# Patient Record
Sex: Female | Born: 1945 | Hispanic: No | Marital: Married | Smoking: Never smoker
Health system: Southern US, Community
[De-identification: ages and names within clinical notes are randomized; demographics above are authoritative.]

## PROBLEM LIST (undated history)

## (undated) DIAGNOSIS — M810 Age-related osteoporosis without current pathological fracture: Secondary | ICD-10-CM

## (undated) DIAGNOSIS — N059 Unspecified nephritic syndrome with unspecified morphologic changes: Secondary | ICD-10-CM

## (undated) DIAGNOSIS — E119 Type 2 diabetes mellitus without complications: Secondary | ICD-10-CM

## (undated) DIAGNOSIS — I10 Essential (primary) hypertension: Secondary | ICD-10-CM

## (undated) DIAGNOSIS — E785 Hyperlipidemia, unspecified: Secondary | ICD-10-CM

## (undated) DIAGNOSIS — T7840XA Allergy, unspecified, initial encounter: Secondary | ICD-10-CM

## (undated) DIAGNOSIS — G4730 Sleep apnea, unspecified: Secondary | ICD-10-CM

## (undated) HISTORY — DX: Allergy, unspecified, initial encounter: T78.40XA

## (undated) HISTORY — DX: Hyperlipidemia, unspecified: E78.5

## (undated) HISTORY — DX: Essential (primary) hypertension: I10

## (undated) HISTORY — PX: FRACTURE SURGERY: SHX138

## (undated) HISTORY — DX: Type 2 diabetes mellitus without complications: E11.9

## (undated) HISTORY — PX: WRIST SURGERY: SHX841

## (undated) HISTORY — DX: Unspecified nephritic syndrome with unspecified morphologic changes: N05.9

## (undated) HISTORY — DX: Age-related osteoporosis without current pathological fracture: M81.0

## (undated) HISTORY — DX: Sleep apnea, unspecified: G47.30

## (undated) HISTORY — PX: COLONOSCOPY: SHX174

---
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
## 1983-04-30 HISTORY — PX: OTHER SURGICAL HISTORY: SHX169

## 1993-04-29 HISTORY — PX: OTHER SURGICAL HISTORY: SHX169

## 2006-04-29 DIAGNOSIS — C801 Malignant (primary) neoplasm, unspecified: Secondary | ICD-10-CM

## 2006-04-29 HISTORY — PX: OTHER SURGICAL HISTORY: SHX169

## 2006-04-29 HISTORY — DX: Malignant (primary) neoplasm, unspecified: C80.1

## 2006-10-23 ENCOUNTER — Other Ambulatory Visit: Admission: RE | Admit: 2006-10-23 | Discharge: 2006-10-23 | Payer: Self-pay | Admitting: Family Medicine

## 2007-12-01 ENCOUNTER — Other Ambulatory Visit: Admission: RE | Admit: 2007-12-01 | Discharge: 2007-12-01 | Payer: Self-pay | Admitting: Family Medicine

## 2008-04-29 HISTORY — PX: BREAST BIOPSY: SHX20

## 2009-02-15 ENCOUNTER — Other Ambulatory Visit: Admission: RE | Admit: 2009-02-15 | Discharge: 2009-02-15 | Payer: Self-pay | Admitting: Family Medicine

## 2010-02-20 ENCOUNTER — Other Ambulatory Visit: Admission: RE | Admit: 2010-02-20 | Discharge: 2010-02-20 | Payer: Self-pay | Admitting: Family Medicine

## 2010-09-12 ENCOUNTER — Encounter (INDEPENDENT_AMBULATORY_CARE_PROVIDER_SITE_OTHER): Payer: Self-pay | Admitting: Surgery

## 2012-05-29 ENCOUNTER — Other Ambulatory Visit: Payer: Self-pay | Admitting: Family Medicine

## 2012-05-29 ENCOUNTER — Other Ambulatory Visit (HOSPITAL_COMMUNITY)
Admission: RE | Admit: 2012-05-29 | Discharge: 2012-05-29 | Disposition: A | Discharge status: Home / Self Care | Payer: BC Managed Care – PPO | Source: Ambulatory Visit | Attending: Family Medicine | Admitting: Family Medicine

## 2012-05-29 DIAGNOSIS — Z124 Encounter for screening for malignant neoplasm of cervix: Secondary | ICD-10-CM | POA: Insufficient documentation

## 2013-06-15 DIAGNOSIS — M8580 Other specified disorders of bone density and structure, unspecified site: Secondary | ICD-10-CM | POA: Insufficient documentation

## 2013-11-08 DIAGNOSIS — D126 Benign neoplasm of colon, unspecified: Secondary | ICD-10-CM | POA: Insufficient documentation

## 2014-03-16 DIAGNOSIS — I152 Hypertension secondary to endocrine disorders: Secondary | ICD-10-CM | POA: Insufficient documentation

## 2014-03-16 DIAGNOSIS — I10 Essential (primary) hypertension: Secondary | ICD-10-CM | POA: Insufficient documentation

## 2014-09-05 DIAGNOSIS — R7301 Impaired fasting glucose: Secondary | ICD-10-CM | POA: Insufficient documentation

## 2014-09-08 DIAGNOSIS — G4733 Obstructive sleep apnea (adult) (pediatric): Secondary | ICD-10-CM | POA: Insufficient documentation

## 2014-09-08 DIAGNOSIS — Z9989 Dependence on other enabling machines and devices: Secondary | ICD-10-CM | POA: Insufficient documentation

## 2015-10-21 DIAGNOSIS — I7789 Other specified disorders of arteries and arterioles: Secondary | ICD-10-CM | POA: Insufficient documentation

## 2018-05-05 ENCOUNTER — Other Ambulatory Visit: Payer: Self-pay | Admitting: Internal Medicine

## 2018-05-05 DIAGNOSIS — Z1231 Encounter for screening mammogram for malignant neoplasm of breast: Secondary | ICD-10-CM

## 2018-05-05 DIAGNOSIS — M810 Age-related osteoporosis without current pathological fracture: Secondary | ICD-10-CM | POA: Insufficient documentation

## 2018-11-12 DIAGNOSIS — J309 Allergic rhinitis, unspecified: Secondary | ICD-10-CM | POA: Insufficient documentation

## 2018-11-12 DIAGNOSIS — G2581 Restless legs syndrome: Secondary | ICD-10-CM | POA: Insufficient documentation

## 2018-11-13 DIAGNOSIS — Z9889 Other specified postprocedural states: Secondary | ICD-10-CM | POA: Insufficient documentation

## 2019-01-11 ENCOUNTER — Other Ambulatory Visit: Payer: Self-pay | Admitting: Pediatrics

## 2019-01-11 DIAGNOSIS — Z1231 Encounter for screening mammogram for malignant neoplasm of breast: Secondary | ICD-10-CM

## 2019-02-16 ENCOUNTER — Ambulatory Visit
Admission: RE | Admit: 2019-02-16 | Discharge: 2019-02-16 | Disposition: A | Discharge status: Home / Self Care | Payer: Medicare HMO | Source: Ambulatory Visit | Attending: Pediatrics | Admitting: Pediatrics

## 2019-02-16 DIAGNOSIS — Z1231 Encounter for screening mammogram for malignant neoplasm of breast: Secondary | ICD-10-CM

## 2019-05-27 ENCOUNTER — Ambulatory Visit: Payer: Medicare HMO

## 2019-06-03 ENCOUNTER — Ambulatory Visit: Payer: Medicare HMO

## 2019-06-05 ENCOUNTER — Ambulatory Visit: Payer: Medicare HMO | Attending: Internal Medicine

## 2019-06-05 DIAGNOSIS — Z23 Encounter for immunization: Secondary | ICD-10-CM

## 2019-06-05 NOTE — Progress Notes (Signed)
   Covid-19 Vaccination Clinic  Name:  Joyce Hatfield    MRN: [id number] DOB: June 04, 1945  06/05/2019  [name] was observed post Covid-19 immunization for 15 minutes without incidence. She was provided with Vaccine Information Sheet and instruction to access the V-Safe system.   [name] was instructed to call 911 with any severe reactions post vaccine: Marland Kitchen Difficulty breathing  . Swelling of your face and throat  . A fast heartbeat  . A bad rash all over your body  . Dizziness and weakness    Immunizations Administered    Name Date Dose VIS Date Route   Pfizer COVID-19 Vaccine 06/05/2019  8:36 AM 0.3 mL 04/09/2019 Intramuscular   Manufacturer: [place]   Lot: [id number]   [place]: [id number]

## 2019-06-30 ENCOUNTER — Ambulatory Visit: Payer: Medicare HMO | Attending: Internal Medicine

## 2019-06-30 DIAGNOSIS — Z23 Encounter for immunization: Secondary | ICD-10-CM | POA: Insufficient documentation

## 2019-06-30 NOTE — Progress Notes (Signed)
   Covid-19 Vaccination Clinic  Name:  Masina Ratzloff    MRN: [id number] DOB: [birth year]  06/30/2019  Ms. Jipson was observed post Covid-19 immunization for 15 minutes without incident. She was provided with Vaccine Information Sheet and instruction to access the V-Safe system.   Ms. Nesheiwat was instructed to call 911 with any severe reactions post vaccine: Marland Kitchen Difficulty breathing  . Swelling of face and throat  . A fast heartbeat  . A bad rash all over body  . Dizziness and weakness   Immunizations Administered    Name Date Dose VIS Date Route   Pfizer COVID-19 Vaccine 06/30/2019  8:35 AM 0.3 mL 04/09/2019 Intramuscular   Manufacturer: [place]   Lot: [id number]   [place]: [id number]

## 2020-02-03 ENCOUNTER — Other Ambulatory Visit: Payer: Self-pay | Admitting: Pediatrics

## 2020-02-03 DIAGNOSIS — Z1231 Encounter for screening mammogram for malignant neoplasm of breast: Secondary | ICD-10-CM

## 2020-04-05 ENCOUNTER — Ambulatory Visit
Admission: RE | Admit: 2020-04-05 | Discharge: 2020-04-05 | Disposition: A | Discharge status: Home / Self Care | Payer: Medicare HMO | Source: Ambulatory Visit | Attending: Pediatrics | Admitting: Pediatrics

## 2020-04-05 ENCOUNTER — Other Ambulatory Visit: Payer: Self-pay

## 2020-04-05 DIAGNOSIS — Z1231 Encounter for screening mammogram for malignant neoplasm of breast: Secondary | ICD-10-CM

## 2020-05-25 ENCOUNTER — Ambulatory Visit: Payer: Medicare HMO | Admitting: Family Medicine

## 2020-06-10 ENCOUNTER — Encounter: Payer: Self-pay | Admitting: Family Medicine

## 2020-06-14 ENCOUNTER — Other Ambulatory Visit: Payer: Self-pay

## 2020-06-14 ENCOUNTER — Encounter: Payer: Self-pay | Admitting: Family Medicine

## 2020-06-14 ENCOUNTER — Ambulatory Visit (INDEPENDENT_AMBULATORY_CARE_PROVIDER_SITE_OTHER): Payer: Medicare HMO | Admitting: Family Medicine

## 2020-06-14 VITALS — BP 122/74 | HR 68 | Temp 97.3°F | Resp 16 | Ht 69.0 in | Wt 181.2 lb

## 2020-06-14 DIAGNOSIS — J309 Allergic rhinitis, unspecified: Secondary | ICD-10-CM

## 2020-06-14 DIAGNOSIS — Z85820 Personal history of malignant melanoma of skin: Secondary | ICD-10-CM | POA: Diagnosis not present

## 2020-06-14 DIAGNOSIS — R7301 Impaired fasting glucose: Secondary | ICD-10-CM | POA: Diagnosis not present

## 2020-06-14 DIAGNOSIS — Z1159 Encounter for screening for other viral diseases: Secondary | ICD-10-CM | POA: Diagnosis not present

## 2020-06-14 DIAGNOSIS — Z9989 Dependence on other enabling machines and devices: Secondary | ICD-10-CM | POA: Diagnosis not present

## 2020-06-14 DIAGNOSIS — G2581 Restless legs syndrome: Secondary | ICD-10-CM | POA: Diagnosis not present

## 2020-06-14 DIAGNOSIS — Z9889 Other specified postprocedural states: Secondary | ICD-10-CM | POA: Diagnosis not present

## 2020-06-14 DIAGNOSIS — G4733 Obstructive sleep apnea (adult) (pediatric): Secondary | ICD-10-CM

## 2020-06-14 DIAGNOSIS — M810 Age-related osteoporosis without current pathological fracture: Secondary | ICD-10-CM | POA: Diagnosis not present

## 2020-06-14 DIAGNOSIS — I10 Essential (primary) hypertension: Secondary | ICD-10-CM | POA: Diagnosis not present

## 2020-06-14 LAB — COMPREHENSIVE METABOLIC PANEL
ALT: 14 U/L (ref 0–35)
AST: 18 U/L (ref 0–37)
Albumin: 4.3 g/dL (ref 3.5–5.2)
Alkaline Phosphatase: 83 U/L (ref 39–117)
BUN: 18 mg/dL (ref 6–23)
CO2: 31 mEq/L (ref 19–32)
Calcium: 10.1 mg/dL (ref 8.4–10.5)
Chloride: 100 mEq/L (ref 96–112)
Creatinine, Ser: 0.79 mg/dL (ref 0.40–1.20)
GFR: 74.86 mL/min (ref >60.00)
Glucose, Bld: 138 mg/dL — ABNORMAL HIGH (ref 70–99)
Potassium: 4 mEq/L (ref 3.5–5.1)
Sodium: 139 mEq/L (ref 135–145)
Total Bilirubin: 1.5 mg/dL — ABNORMAL HIGH (ref 0.2–1.2)
Total Protein: 7 g/dL (ref 6.0–8.3)

## 2020-06-14 LAB — CBC WITH DIFFERENTIAL/PLATELET
Basophils Absolute: 0 10*3/uL (ref 0.0–0.1)
Basophils Relative: 0.6 % (ref 0.0–3.0)
Eosinophils Absolute: 0.1 10*3/uL (ref 0.0–0.7)
Eosinophils Relative: 1.7 % (ref 0.0–5.0)
HCT: 43.2 % (ref 36.0–46.0)
Hemoglobin: 15 g/dL (ref 12.0–15.0)
Lymphocytes Relative: 24.8 % (ref 12.0–46.0)
Lymphs Abs: 1.5 10*3/uL (ref 0.7–4.0)
MCHC: 34.7 g/dL (ref 30.0–36.0)
MCV: 86.7 fl (ref 78.0–100.0)
Monocytes Absolute: 0.3 10*3/uL (ref 0.1–1.0)
Monocytes Relative: 5.5 % (ref 3.0–12.0)
Neutro Abs: 4.2 10*3/uL (ref 1.4–7.7)
Neutrophils Relative %: 67.4 % (ref 43.0–77.0)
Platelets: 235 10*3/uL (ref 150.0–400.0)
RBC: 4.98 Mil/uL (ref 3.87–5.11)
RDW: 12.9 % (ref 11.5–15.5)
WBC: 6.2 10*3/uL (ref 4.0–10.5)

## 2020-06-14 LAB — TSH: TSH: 0.99 u[IU]/mL (ref 0.35–4.50)

## 2020-06-14 LAB — LIPID PANEL
Cholesterol: 227 mg/dL — ABNORMAL HIGH (ref 0–200)
HDL: 65 mg/dL (ref >39.00)
LDL Cholesterol: 125 mg/dL — ABNORMAL HIGH (ref 0–99)
NonHDL: 161.75
Total CHOL/HDL Ratio: 3
Triglycerides: 182 mg/dL — ABNORMAL HIGH (ref 0.0–149.0)
VLDL: 36.4 mg/dL (ref 0.0–40.0)

## 2020-06-14 LAB — HEMOGLOBIN A1C: Hgb A1c MFr Bld: 7.2 % — ABNORMAL HIGH (ref 4.6–6.5)

## 2020-06-14 MED ORDER — HYDROCHLOROTHIAZIDE 25 MG PO TABS: 25.0000 mg | ORAL_TABLET | Freq: Every day | ORAL | 3 refills | Status: DC

## 2020-06-14 NOTE — Patient Instructions (Signed)
Please return in 6 months for your annual complete physical; please come fasting.  I will release your lab results to you on your MyChart account with further instructions. Please reply with any questions.   It was a pleasure meeting you today! Thank you for choosing [place] to meet your healthcare needs! I truly look forward to working with you. If you have any questions or concerns, please send me a message via Mychart or call the office at [phone].  I have refilled your HCTZ; you may send in refill requests when you need refills on your other medications.

## 2020-06-14 NOTE — Progress Notes (Signed)
Subjective  CC:  Chief Complaint  Patient presents with  . New Patient (Initial Visit)    Patient is new to the area, previously seen by Dr. Janene Harvey with [facility]    HPI: Joyce Hatfield is a 75 y.o. female who presents to [facility] [place] today to establish care with me as a new patient.  I reviewed old records from most recent primary care provider.  Most recent labs are from July 2020. She has the following concerns or needs:  75 year old married female without children presents to establish care.  She is very healthy overall.  She lives an active lifestyle.  She bicycles and now rides a recumbent trike.  She hikes almost daily.  She has no new complaints.  We reviewed her past medical history documented in problem list below.  History of hypertension and has been well controlled on lisinopril 10 mg daily and HCTZ 25 mg daily.  She has no heart disease.  She denies chest pain, palpitations, or shortness of breath.  She has occasional lower extremity mild edema.  She is overdue for blood work.  History of impaired fasting glucose without symptoms of hyperglycemia.  She eats a healthy diet.  She is active as noted above.  History of tubular adenoma with most recent colonoscopy.  She is on a every 5 year surveillance.  She will need a new referral to gastroenterologist.  She uses CPAP for obstructive sleep apnea.  She feels this works well for her  She has osteoporosis and has been on Fosamax for the last 2 years.  Prior to that she was on a drug holiday.  After having completed 5 years of Fosamax.  It looks like she was started while osteopenia with an elevated FRAX score.  She did have 1 wrist fracture from a fall that was deemed nonphysiologic.  She had a history of hip, wrist and collarbone fractures that were traumatic.  I reviewed her most recent bone density scans.  She currently is in the osteopenic range after treatment.  She has no adverse effects from her  medications.  She tolerates them well.  Restless leg syndrome which is well controlled after supplementation with iron.  She is not on prescription medicine for this.  She sleeps well.  She has a history of a melanoma.  Health maintenance: Mammogram was done in December.  Bone density will be due in December of this year.  All immunizations are up-to-date.  Assessment  1. Essential hypertension   2. Impaired fasting glucose   3. OSA on CPAP   4. Osteoporosis, postmenopausal   5. Restless legs   6. Hx of melanoma excision   7. Chronic allergic rhinitis   8. Need for hepatitis C screening test      Plan   Chronic medical conditions are discussed in detail.  All are well controlled.  I recommend continuation of her current medications as listed below.  I did not change any dosage of medications.  I refilled hydrochlorothiazide.  We will recheck renal function, electrolytes, liver tests, A1c and lipid panel.  She will follow-up in 6 months for complete physical  Follow up: CPE Orders Placed This Encounter  Procedures  . CBC with Differential/Platelet  . Comprehensive metabolic panel  . Lipid panel  . TSH  . Hemoglobin A1c  . Hepatitis C antibody   Meds ordered this encounter  Medications  . hydrochlorothiazide (HYDRODIURIL) 25 MG tablet    Sig: Take 1  tablet (25 mg total) by mouth daily.    Dispense:  90 tablet    Refill:  3     Depression screen PHQ 2/9 06/14/2020  Decreased Interest 0  Down, Depressed, Hopeless 0  PHQ - 2 Score 0    We updated and reviewed the patient's past history in detail and it is documented below.  Patient Active Problem List   Diagnosis Date Noted  . Hx of melanoma excision 11/13/2018  . Chronic allergic rhinitis 11/12/2018  . Restless legs 11/12/2018  . Osteoporosis, postmenopausal 05/05/2018  . 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 OSA on CPAP 09/08/2014  . Impaired fasting glucose 09/05/2014  . Essential hypertension 03/16/2014  .  Serrated adenoma of colon 11/08/2013    Formatting of this note might be different from the original. 2015 sessile serrated adenoma size ? And tubular adenoma recheck 5 yr   . Osteopenia 06/15/2013    Formatting of this note might be different from the original. DXA has been sl osteopenic has been on fosamax for about 5 years and evista 07/27/2013 DXA FRAX 15 / 1.7  09/25/2016 FRAX 19/3.4 ; low impact distal radius fracture.    Health Maintenance  Topic Date Due  . DEXA SCAN  04/29/2020  . MAMMOGRAM  04/05/2021  . COLONOSCOPY (Pts 45-43yrs Insurance coverage will need to be confirmed)  01/29/2024  . TETANUS/TDAP  04/05/2027  . INFLUENZA VACCINE  Completed  . COVID-19 Vaccine  Completed  . PNA vac Low Risk Adult  Completed  . Hepatitis C Screening  Discontinued   Immunization History  Administered Date(s) Administered  . Influenza Inj Mdck Quad Pf 05/05/2018  . Influenza, High Dose Seasonal PF 02/16/2016, 02/05/2017, 02/03/2019  . Influenza-Unspecified 02/24/2020  . PFIZER(Purple Top)SARS-COV-2 Vaccination 06/05/2019, 06/30/2019, 02/04/2020  . Pneumococcal Conjugate-13 06/15/2013  . Pneumococcal Polysaccharide-23 09/28/2015  . Tdap 10/23/2006, 04/04/2017  . Zoster 04/30/2007, 03/05/2010  . Zoster Recombinat (Shingrix) 10/09/2016, 12/22/2016   Current Meds  Medication Sig  . alendronate (FOSAMAX) 70 MG tablet Take 70 mg by mouth every 7 (seven) days. Take with a full glass of water on an empty stomach.  . Calcium Carbonate-Vitamin D 600-400 MG-UNIT tablet Take 1 tablet by mouth daily.  . Cholecalciferol (VITAMIN D) 1000 UNITS capsule Take 1,000 Units by mouth daily.  Marland Kitchen lisinopril (PRINIVIL,ZESTRIL) 10 MG tablet Take 10 mg by mouth daily.  . Multiple Vitamin (MULTIVITAMIN) capsule Take 1 capsule by mouth daily.  Marland Kitchen triamcinolone (KENALOG) 0.1 % Apply 1 application topically 2 (two) times daily.  . vitamin C (ASCORBIC ACID) 500 MG tablet Take 500 mg by mouth daily.  .  [DISCONTINUED] hydrochlorothiazide 25 MG tablet Take 25 mg by mouth daily.    Allergies: Patient is allergic to bee venom. Past Medical History Patient  has a past medical history of Cancer ([place]) (2008), Osteoporosis, and Sleep apnea. Past Surgical History Patient  has a past surgical history that includes BIRTH [place] (1959); BROKEN HIP (1995); MELANOMA REMOVED (2008); ENDOMETRIOUSIS LAPAROSCOPY (1985); and Breast biopsy ([place], 2010). Family History: Patient family history includes Breast cancer in her paternal aunt; Cancer in her mother; Diabetes in her brother; Early death in her paternal grandmother; Heart disease in her father and paternal grandfather; Hypertension in her father and mother. Social History:  Patient  reports that she has never smoked. She has never used smokeless tobacco. She reports that she does not drink alcohol and does not use drugs.  Review of Systems: Constitutional: negative for fever or malaise  Ophthalmic: negative for photophobia, double vision or loss of vision Cardiovascular: negative for chest pain, dyspnea on exertion, or new LE swelling Respiratory: negative for SOB or persistent cough Gastrointestinal: negative for abdominal pain, change in bowel habits or melena Genitourinary: negative for dysuria or gross hematuria Musculoskeletal: negative for new gait disturbance or muscular weakness Integumentary: negative for new or persistent rashes Neurological: negative for TIA or stroke symptoms Psychiatric: negative for SI or delusions Allergic/Immunologic: negative for hives  Patient Care Team    Relationship Specialty Notifications Start End  Leamon Arnt, MD PCP - General Family Medicine  06/14/20     Objective  Vitals: BP 122/74   Pulse 68   Temp (!) 97.3 F (36.3 C) (Temporal)   Resp 16   Ht 5\' 9"  (1.753 m)   Wt 181 lb 3.2 oz (82.2 kg)   SpO2 98%   BMI 26.76 kg/m  General:  Well developed, well nourished, no acute distress   Psych:  Alert and oriented,normal mood and affect HEENT:  Normocephalic, atraumatic, non-icteric sclera, supple neck without adenopathy, mass or thyromegaly Cardiovascular:  RRR without gallop, rub or murmur, trace edema Respiratory:  Good breath sounds bilaterally, CTAB with normal respiratory effort Neurologic:    Mental status is normal. Gross motor and sensory exams are normal. Normal gait   Commons side effects, risks, benefits, and alternatives for medications and treatment plan prescribed today were discussed, and the patient expressed understanding of the given instructions. Patient is instructed to call or message via MyChart if he/she has any questions or concerns regarding our treatment plan. No barriers to understanding were identified. We discussed Red Flag symptoms and signs in detail. Patient expressed understanding regarding what to do in case of urgent or emergency type symptoms.   Medication list was reconciled, printed and provided to the patient in AVS. Patient instructions and summary information was reviewed with the patient as documented in the AVS. This note was prepared with assistance of Dragon voice recognition software. Occasional wrong-word or sound-a-like substitutions may have occurred due to the inherent limitations of voice recognition software  This visit occurred during the SARS-CoV-2 public health emergency.  Safety protocols were in place, including screening questions prior to the visit, additional usage of staff PPE, and extensive cleaning of exam room while observing appropriate contact time as indicated for disinfecting solutions.

## 2020-06-15 LAB — HEPATITIS C ANTIBODY
Hepatitis C Ab: NONREACTIVE
SIGNAL TO CUT-OFF: 0.01 (ref <1.00)

## 2020-06-26 ENCOUNTER — Encounter: Payer: Self-pay | Admitting: Family Medicine

## 2020-06-26 ENCOUNTER — Other Ambulatory Visit: Payer: Self-pay

## 2020-06-26 ENCOUNTER — Ambulatory Visit: Payer: Medicare HMO | Admitting: Family Medicine

## 2020-06-26 VITALS — BP 118/72 | HR 72 | Temp 97.6°F | Resp 16 | Ht 69.0 in | Wt 181.0 lb

## 2020-06-26 DIAGNOSIS — E1169 Type 2 diabetes mellitus with other specified complication: Secondary | ICD-10-CM | POA: Insufficient documentation

## 2020-06-26 DIAGNOSIS — E119 Type 2 diabetes mellitus without complications: Secondary | ICD-10-CM | POA: Diagnosis not present

## 2020-06-26 DIAGNOSIS — E785 Hyperlipidemia, unspecified: Secondary | ICD-10-CM | POA: Diagnosis not present

## 2020-06-26 DIAGNOSIS — I10 Essential (primary) hypertension: Secondary | ICD-10-CM

## 2020-06-26 LAB — MICROALBUMIN / CREATININE URINE RATIO
Creatinine,U: 61.4 mg/dL
Microalb Creat Ratio: 1.6 mg/g (ref 0.0–30.0)
Microalb, Ur: 1 mg/dL (ref 0.0–1.9)

## 2020-06-26 NOTE — Progress Notes (Signed)
Subjective  CC:  Chief Complaint  Patient presents with  . Diabetes    F/u, discuss medication and lifestyle changes     HPI: Joyce Hatfield is a 75 y.o. female who presents to the office today for follow up of diabetes and problems listed above in the chief complaint.   New onset diabetes: 75 year old here for new diagnosis of diabetes.  Recent screening A1c was 7.2.  Patient reports that she has had prediabetic readings in the past.  Her diet is fairly good although she does eat more carbs and sweets than she knows she should.  Her husband is a diabetic and has a hard time following a diabetic diet.  She denies any symptoms of hypoglycemia.  She had a recent eye exam in November which was normal.  Immunizations are up-to-date.  She prefers to not be on medications unless absolutely needed.  She is on ACE inhibitor for hypertension.   Wt Readings from Last 3 Encounters:  06/26/20 181 lb (82.1 kg)  06/14/20 181 lb 3.2 oz (82.2 kg)    BP Readings from Last 3 Encounters:  06/26/20 118/72  06/14/20 122/74    Assessment  1. New onset type 2 diabetes mellitus ([place])   2. Essential hypertension   3. Dyslipidemia      Plan   Diabetes is currently marginally controlled.  We discussed diagnosis, standards of care.  Possible complications.  Recommend therapeutics.  First, we will start with diabetes and nutrition consultation.  She will work on improving her diet.  We will recheck A1c in 3 months.  Hold off on initiating diabetic medications at this time.  This is a very reasonable approach.  We discussed possible use of statin therapy.  Again she defers this for the time being.  Blood pressure is good.  Continue ACE inhibitor.  Immunizations are up-to-date.  Eye exam is up-to-date.  Counseling and discussion regarding multiple possible effects from diabetes done today, visit lasting 30 minutes.  Follow up: 3 months for diabetes recheck. Orders Placed This Encounter  Procedures  .  Microalbumin / creatinine urine ratio  . Referral to Nutrition and Diabetes Services   No orders of the defined types were placed in this encounter.     Immunization History  Administered Date(s) Administered  . Influenza Inj Mdck Quad Pf 05/05/2018  . Influenza, High Dose Seasonal PF 02/16/2016, 02/05/2017, 02/03/2019  . Influenza-Unspecified 02/24/2020  . PFIZER(Purple Top)SARS-COV-2 Vaccination 06/05/2019, 06/30/2019, 02/04/2020  . Pneumococcal Conjugate-13 06/15/2013  . Pneumococcal Polysaccharide-23 09/28/2015  . Tdap 10/23/2006, 04/04/2017  . Zoster 04/30/2007, 03/05/2010  . Zoster Recombinat (Shingrix) 10/09/2016, 12/22/2016    Diabetes Related Lab Review: Lab Results  Component Value Date   HGBA1C 7.2 (H) 06/14/2020    No results found for: Derl Barrow Lab Results  Component Value Date   CREATININE 0.79 06/14/2020   BUN 18 06/14/2020   NA 139 06/14/2020   K 4.0 06/14/2020   CL 100 06/14/2020   CO2 31 06/14/2020   Lab Results  Component Value Date   CHOL 227 (H) 06/14/2020   Lab Results  Component Value Date   HDL 65.00 06/14/2020   Lab Results  Component Value Date   LDLCALC 125 (H) 06/14/2020   Lab Results  Component Value Date   TRIG 182.0 (H) 06/14/2020   Lab Results  Component Value Date   CHOLHDL 3 06/14/2020   No results found for: LDLDIRECT The 10-year ASCVD risk score Mikey Bussing DC Jr., et al., 2013)  is: 24.4%   Values used to calculate the score:     Age: 55 years     Sex: Female     Is Non-Hispanic African American: No     Diabetic: Yes     Tobacco smoker: No     Systolic Blood Pressure: 502 mmHg     Is BP treated: Yes     HDL Cholesterol: 65 mg/dL     Total Cholesterol: 227 mg/dL I have reviewed the PMH, Fam and Soc history. Patient Active Problem List   Diagnosis Date Noted  . New onset type 2 diabetes mellitus ([place]) 06/26/2020  . Dyslipidemia 06/26/2020  . Hx of melanoma excision 11/13/2018  . Chronic allergic rhinitis  11/12/2018  . Restless legs 11/12/2018  . Osteoporosis, postmenopausal 05/05/2018  . 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 OSA on CPAP 09/08/2014  . Essential hypertension 03/16/2014  . Serrated adenoma of colon 11/08/2013    Formatting of this note might be different from the original. 2015 sessile serrated adenoma size ? And tubular adenoma recheck 5 yr   . Osteopenia 06/15/2013    Formatting of this note might be different from the original. DXA has been sl osteopenic has been on fosamax for about 5 years and evista 07/27/2013 DXA FRAX 15 / 1.7  09/25/2016 FRAX 19/3.4 ; low impact distal radius fracture.     Social History: Patient  reports that she has never smoked. She has never used smokeless tobacco. She reports that she does not drink alcohol and does not use drugs.  Review of Systems: Ophthalmic: negative for eye pain, loss of vision or double vision Cardiovascular: negative for chest pain Respiratory: negative for SOB or persistent cough Gastrointestinal: negative for abdominal pain Genitourinary: negative for dysuria or gross hematuria MSK: negative for foot lesions Neurologic: negative for weakness or gait disturbance  Objective  Vitals: BP 118/72   Pulse 72   Temp 97.6 F (36.4 C) (Temporal)   Resp 16   Ht 5\' 9"  (1.753 m)   Wt 181 lb (82.1 kg)   SpO2 97%   BMI 26.73 kg/m  General: well appearing, no acute distress    Diabetic education: ongoing education regarding chronic disease management for diabetes was given today. We continue to reinforce the ABC's of diabetic management: A1c (<7 or 8 dependent upon patient), tight blood pressure control, and cholesterol management with goal LDL < 100 minimally. We discuss diet strategies, exercise recommendations, medication options and possible side effects. At each visit, we review recommended immunizations and preventive care recommendations for diabetics and stress that good diabetic control can prevent other  problems. See below for this patient's data.    Commons side effects, risks, benefits, and alternatives for medications and treatment plan prescribed today were discussed, and the patient expressed understanding of the given instructions. Patient is instructed to call or message via MyChart if he/she has any questions or concerns regarding our treatment plan. No barriers to understanding were identified. We discussed Red Flag symptoms and signs in detail. Patient expressed understanding regarding what to do in case of urgent or emergency type symptoms.   Medication list was reconciled, printed and provided to the patient in AVS. Patient instructions and summary information was reviewed with the patient as documented in the AVS. This note was prepared with assistance of Dragon voice recognition software. Occasional wrong-word or sound-a-like substitutions may have occurred due to the inherent limitations of voice recognition software  This visit occurred during the SARS-CoV-2 public health emergency.  Safety protocols were in place, including screening questions prior to the visit, additional usage of staff PPE, and extensive cleaning of exam room while observing appropriate contact time as indicated for disinfecting solutions.

## 2020-06-26 NOTE — Patient Instructions (Addendum)
Please return in 3 months for diabetes follow up  I have placed a referral for diabetic nutrition education and we should be calling you to get this set up.   If you have any questions or concerns, please don't hesitate to send me a message via MyChart or call the office at [phone]. Thank you for visiting with [place] today! It's our pleasure caring for you.   Diabetes Mellitus and Nutrition, Adult When you have diabetes, or diabetes mellitus, it is very important to have healthy eating habits because your blood sugar (glucose) levels are greatly affected by what you eat and drink. Eating healthy foods in the right amounts, at about the same times every day, can help you:  Control your blood glucose.  Lower your risk of heart disease.  Improve your blood pressure.  Reach or maintain a healthy weight. What can affect my meal plan? Every person with diabetes is different, and each person has different needs for a meal plan. Your health care provider may recommend that you work with a dietitian to make a meal plan that is best for you. Your meal plan may vary depending on factors such as:  The calories you need.  The medicines you take.  Your weight.  Your blood glucose, blood pressure, and cholesterol levels.  Your activity level.  Other health conditions you have, such as heart or kidney disease. How do carbohydrates affect me? Carbohydrates, also called carbs, affect your blood glucose level more than any other type of food. Eating carbs naturally raises the amount of glucose in your blood. Carb counting is a method for keeping track of how many carbs you eat. Counting carbs is important to keep your blood glucose at a healthy level, especially if you use insulin or take certain oral diabetes medicines. It is important to know how many carbs you can safely have in each meal. This is different for every person. Your dietitian can help you calculate how many carbs you should have at  each meal and for each snack. How does alcohol affect me? Alcohol can cause a sudden decrease in blood glucose (hypoglycemia), especially if you use insulin or take certain oral diabetes medicines. Hypoglycemia can be a life-threatening condition. Symptoms of hypoglycemia, such as sleepiness, dizziness, and confusion, are similar to symptoms of having too much alcohol.  Do not drink alcohol if: ? Your health care provider tells you not to drink. ? You are pregnant, may be pregnant, or are planning to become pregnant.  If you drink alcohol: ? Do not drink on an empty stomach. ? Limit how much you use to:  0-1 drink a day for women.  0-2 drinks a day for men. ? Be aware of how much alcohol is in your drink. In the [place] one drink equals one 12 oz bottle of beer (355 mL), one 5 oz glass of wine (148 mL), or one 1 oz glass of hard liquor (44 mL). ? Keep yourself hydrated with water, diet soda, or unsweetened iced tea.  Keep in mind that regular soda, juice, and other mixers may contain a lot of sugar and must be counted as carbs. What are tips for following this plan? Reading food labels  Start by checking the serving size on the "Nutrition Facts" label of packaged foods and drinks. The amount of calories, carbs, fats, and other nutrients listed on the label is based on one serving of the item. Many items contain more than one serving per package.  Check  the total grams (g) of carbs in one serving. You can calculate the number of servings of carbs in one serving by dividing the total carbs by 15. For example, if a food has 30 g of total carbs per serving, it would be equal to 2 servings of carbs.  Check the number of grams (g) of saturated fats and trans fats in one serving. Choose foods that have a low amount or none of these fats.  Check the number of milligrams (mg) of salt (sodium) in one serving. Most people should limit total sodium intake to less than 2,300 mg per day.  Always check  the nutrition information of foods labeled as "low-fat" or "nonfat." These foods may be higher in added sugar or refined carbs and should be avoided.  Talk to your dietitian to identify your daily goals for nutrients listed on the label. Shopping  Avoid buying canned, pre-made, or processed foods. These foods tend to be high in fat, sodium, and added sugar.  Shop around the outside edge of the grocery store. This is where you will most often find fresh fruits and vegetables, bulk grains, fresh meats, and fresh dairy. Cooking  Use low-heat cooking methods, such as baking, instead of high-heat cooking methods like deep frying.  Cook using healthy oils, such as olive, canola, or sunflower oil.  Avoid cooking with butter, cream, or high-fat meats. Meal planning  Eat meals and snacks regularly, preferably at the same times every day. Avoid going long periods of time without eating.  Eat foods that are high in fiber, such as fresh fruits, vegetables, beans, and whole grains. Talk with your dietitian about how many servings of carbs you can eat at each meal.  Eat 4-6 oz (112-168 g) of lean protein each day, such as lean meat, chicken, fish, eggs, or tofu. One ounce (oz) of lean protein is equal to: ? 1 oz (28 g) of meat, chicken, or fish. ? 1 egg. ?  cup (62 g) of tofu.  Eat some foods each day that contain healthy fats, such as avocado, nuts, seeds, and fish.   What foods should I eat? Fruits Berries. Apples. Oranges. Peaches. Apricots. Plums. Grapes. Mango. Papaya. Pomegranate. Kiwi. Cherries. Vegetables Lettuce. Spinach. Leafy greens, including kale, chard, collard greens, and mustard greens. Beets. Cauliflower. Cabbage. Broccoli. Carrots. Green beans. Tomatoes. Peppers. Onions. Cucumbers. Brussels sprouts. Grains Whole grains, such as whole-wheat or whole-grain bread, crackers, tortillas, cereal, and pasta. Unsweetened oatmeal. Quinoa. Brown or wild rice. Meats and other  proteins Seafood. Poultry without skin. Lean cuts of poultry and beef. Tofu. Nuts. Seeds. Dairy Low-fat or fat-free dairy products such as milk, yogurt, and cheese. The items listed above may not be a complete list of foods and beverages you can eat. Contact a dietitian for more information. What foods should I avoid? Fruits Fruits canned with syrup. Vegetables Canned vegetables. Frozen vegetables with butter or cream sauce. Grains Refined white flour and flour products such as bread, pasta, snack foods, and cereals. Avoid all processed foods. Meats and other proteins Fatty cuts of meat. Poultry with skin. Breaded or fried meats. Processed meat. Avoid saturated fats. Dairy Full-fat yogurt, cheese, or milk. Beverages Sweetened drinks, such as soda or iced tea. The items listed above may not be a complete list of foods and beverages you should avoid. Contact a dietitian for more information. Questions to ask a health care provider  Do I need to meet with a diabetes educator?  Do I need to meet  with a dietitian?  What number can I call if I have questions?  When are the best times to check my blood glucose? Where to find more information:  American Diabetes Association: diabetes.org  Academy of Nutrition and Dietetics: www.eatright.[employer] of Diabetes and Digestive and Kidney Diseases: [employer]dk  Association of Diabetes Care and Education Specialists: www.diabeteseducator.org Summary  It is important to have healthy eating habits because your blood sugar (glucose) levels are greatly affected by what you eat and drink.  A healthy meal plan will help you control your blood glucose and maintain a healthy lifestyle.  Your health care provider may recommend that you work with a dietitian to make a meal plan that is best for you.  Keep in mind that carbohydrates (carbs) and alcohol have immediate effects on your blood glucose levels. It is important to  count carbs and to use alcohol carefully. This information is not intended to replace advice given to you by your health care provider. Make sure you discuss any questions you have with your health care provider. Document Revised: 03/23/2019 Document Reviewed: 03/23/2019 Elsevier Patient Education  2021 [place].   Diabetes Mellitus and Standards of Medical Care Living with and managing diabetes (diabetes mellitus) can be complicated. Your diabetes treatment may be managed by a team of health care providers, including:  A physician who specializes in diabetes (endocrinologist). You might also have visits with a nurse practitioner or physician assistant.  Nurses.  A registered dietitian.  A certified diabetes care and education specialist.  An exercise specialist.  A pharmacist.  An eye doctor.  A foot specialist (podiatrist).  A dental care provider.  A primary care provider.  A mental health care provider. How to manage your diabetes You can do many things to successfully manage your diabetes. Your health care providers will follow guidelines to help you get the best quality of care. Here are general guidelines for your diabetes management plan. Your health care providers may give you more specific instructions. Physical exams When you are diagnosed with diabetes, and each year after that, your health care provider will ask about your medical and family history. You will have a physical exam, which may include:  Measuring your height, weight, and body mass index (BMI).  Checking your blood pressure. This will be done at every routine medical visit. Your target blood pressure may vary depending on your medical conditions, your age, and other factors.  A thyroid exam.  A skin exam.  Screening for nerve damage (peripheral neuropathy). This may include checking the pulse in your legs and feet and the level of sensation in your hands and feet.  A foot exam to inspect the  structure and skin of your feet, including checking for cuts, bruises, redness, blisters, sores, or other problems.  Screening for blood vessel (vascular) problems. This may include checking the pulse in your legs and feet and checking your temperature. Blood tests Depending on your treatment plan and your personal needs, you may have the following tests:  Hemoglobin A1C (HbA1C). This test provides information about blood sugar (glucose) control over the previous 2-3 months. It is used to adjust your treatment plan, if needed. This test will be done: ? At least 2 times a year, if you are meeting your treatment goals. ? 4 times a year, if you are not meeting your treatment goals or if your goals have changed.  Lipid testing, including total cholesterol, LDL and HDL cholesterol, and triglyceride levels. ?  The goal for LDL is less than 100 mg/dL (5.5 mmol/L). If you are at high risk for complications, the goal is less than 70 mg/dL (3.9 mmol/L). ? The goal for HDL is 40 mg/dL (2.2 mmol/L) or higher for men, and 50 mg/dL (2.8 mmol/L) or higher for women. An HDL cholesterol of 60 mg/dL (3.3 mmol/L) or higher gives some protection against heart disease. ? The goal for triglycerides is less than 150 mg/dL (8.3 mmol/L).  Liver function tests.  Kidney function tests.  Thyroid function tests.   Dental and eye exams  Visit your dentist two times a year.  If you have type 1 diabetes, your health care provider may recommend an eye exam within 5 years after you are diagnosed, and then once a year after your first exam. ? For children with type 1 diabetes, the health care provider may recommend an eye exam when your child is age 58 or older and has had diabetes for 3-5 years. After the first exam, your child should get an eye exam once a year.  If you have type 2 diabetes, your health care provider may recommend an eye exam as soon as you are diagnosed, and then every 1-2 years after your first exam.    Immunizations  A yearly flu (influenza) vaccine is recommended annually for everyone 6 months or older. This is especially important if you have diabetes.  The pneumonia (pneumococcal) vaccine is recommended for everyone 2 years or older who has diabetes. If you are age 56 or older, you may get the pneumonia vaccine as a series of two separate shots.  The hepatitis B vaccine is recommended for adults shortly after being diagnosed with diabetes. Adults and children with diabetes should receive all other vaccines according to age-specific recommendations from the Centers for Disease Control and Prevention (CDC). Mental and emotional health Screening for symptoms of eating disorders, anxiety, and depression is recommended at the time of diagnosis and after as needed. If your screening shows that you have symptoms, you may need more evaluation. You may work with a mental health care provider. Follow these instructions at home: Treatment plan You will monitor your blood glucose levels and may give yourself insulin. Your treatment plan will be reviewed at every medical visit. You and your health care provider will discuss:  How you are taking your medicines, including insulin.  Any side effects you have.  Your blood glucose level target goals.  How often you monitor your blood glucose level.  Lifestyle habits, such as activity level and tobacco, alcohol, and substance use. Education Your health care provider will assess how well you are monitoring your blood glucose levels and whether you are taking your insulin and medicines correctly. He or she may refer you to:  A certified diabetes care and education specialist to manage your diabetes throughout your life, starting at diagnosis.  A registered dietitian who can create and review your personal nutrition plan.  An exercise specialist who can discuss your activity level and exercise plan. General instructions  Take over-the-counter and  prescription medicines only as told by your health care provider.  Keep all follow-up visits. This is important. Where to find support There are many diabetes support networks, including:  American Diabetes Association (ADA): diabetes.org  Defeat Diabetes Foundation: defeatdiabetes.org Where to find more information  American Diabetes Association (ADA): www.diabetes.org  Association of Diabetes Care & Education Specialists (ADCES): diabeteseducator.org  International Diabetes Federation (IDF): https://www.munoz-bell.org/ Summary  Managing diabetes (diabetes mellitus) can be  complicated. Your diabetes treatment may be managed by a team of health care providers.  Your health care providers follow guidelines to help you get the best quality care.  You should have physical exams, blood tests, blood pressure monitoring, immunizations, and screening tests regularly. Stay updated on how to manage your diabetes.  Your health care providers may also give you more specific instructions based on your individual health. This information is not intended to replace advice given to you by your health care provider. Make sure you discuss any questions you have with your health care provider. Document Revised: 10/21/2019 Document Reviewed: 10/21/2019 Elsevier Patient Education  [place].

## 2020-07-12 DIAGNOSIS — Z0100 Encounter for examination of eyes and vision without abnormal findings: Secondary | ICD-10-CM | POA: Diagnosis not present

## 2020-07-19 ENCOUNTER — Other Ambulatory Visit: Payer: Self-pay

## 2020-07-19 ENCOUNTER — Encounter: Payer: Self-pay | Admitting: Registered"

## 2020-07-19 ENCOUNTER — Encounter: Payer: Medicare HMO | Attending: Family Medicine | Admitting: Registered"

## 2020-07-19 DIAGNOSIS — E119 Type 2 diabetes mellitus without complications: Secondary | ICD-10-CM | POA: Insufficient documentation

## 2020-07-19 NOTE — Progress Notes (Signed)
Diabetes Self-Management Education  Visit Type: First/Initial  Appt. Start Time: 1400 Appt. End Time: 1500  07/19/2020  Ms. Elaiza Shoberg, identified by name and date of birth, is a 75 y.o. female with a diagnosis of Diabetes: Type 2.   ASSESSMENT  There were no vitals taken for this visit. There is no height or weight on file to calculate BMI.   06/14/20 A1c 7.2% (new diagnosis);  Patient states she was given 3 month before discussing meds. Pt reports after diagnosis she cut back on carbs a month ago has lost about 5 lbs. Pt states she has a good understanding of estimating carbohydrates because she has gone with her husband to learn out his type 2 diabetes as well as recently read a book published by [facility]. Pt demonstrated good understanding of appropriate amount of carbs per meal (30-45 grams).  Patient had questions about the recommendations for cheese, yogurt and eggs with regards to fat content.  Sleep: patient states she has not slept well for the last 15 yrs due to caffeine consumption or mind chatter.    Diabetes Self-Management Education - 07/19/20 1402      Visit Information   Visit Type First/Initial      Initial Visit   Diabetes Type Type 2    Are you currently following a meal plan? No    Are you taking your medications as prescribed? Not on Medications    Date Diagnosed March 2022      Health Coping   How would you rate your overall health? Excellent      Psychosocial Assessment   Patient Belief/Attitude about Diabetes Motivated to manage diabetes    How often do you need to have someone help you when you read instructions, pamphlets, or other written materials from your doctor or pharmacy? 1 - Never    What is the last grade level you completed in school? college graduate      Complications   Last HgB A1C per patient/outside source 7.2 %    How often do you check your blood sugar? 0 times/day (not testing)    Have you had a dilated eye exam in the  past 12 months? Yes    Have you had a dental exam in the past 12 months? Yes    Are you checking your feet? Yes    How many days per week are you checking your feet? 7      Dietary Intake   Breakfast tortilla, black beans, egg, salsa, spinach, a little bit of cheese OR quiche, black tea, stevia    Lunch soup, crackers and cheese OR spinach salad, shrimp poppers, toast with hummus    Snack (afternoon) cheese, crackers OR kind mini bars OR trailmix    Dinner pasta, tofu, vegetables, not much tomato sauce (skipped bread) acorn & butternut squash    Snack (evening) 3 crackers, cheese or hummus    Beverage(s) lemonade powder sweetened with stevia, tea, no alcohol, water      Exercise   Exercise Type Moderate (swimming / aerobic walking)    How many days per week to you exercise? 7    How many minutes per day do you exercise? 100    Total minutes per week of exercise 700      Patient Education   Previous Diabetes Education Yes (please comment)   3-4 yrs ago with husband's T2DM appt   Nutrition management  Role of diet in the treatment of diabetes and the relationship between  the three main macronutrients and blood glucose level;Carbohydrate counting;Food label reading, portion sizes and measuring food.    Physical activity and exercise  Role of exercise on diabetes management, blood pressure control and cardiac health.    Monitoring Identified appropriate SMBG and/or A1C goals.    Chronic complications Lipid levels, blood glucose control and heart disease    Psychosocial adjustment Role of stress on diabetes      Individualized Goals (developed by patient)   Nutrition General guidelines for healthy choices and portions discussed    Physical Activity Exercise 5-7 days per week      Outcomes   Expected Outcomes Demonstrated interest in learning. Expect positive outcomes    Future DMSE PRN    Program Status Completed           Individualized Plan for Diabetes Self-Management Training:    Learning Objective:  Patient will have a greater understanding of diabetes self-management. Patient education plan is to attend individual and/or group sessions per assessed needs and concerns.    Patient Instructions  Plan:  Consider creating a relaxing nighttime routing. Adjust the light settings on your phone (blue light filter) Aim for 2-3 Carb Choices per meal (30-45 grams) +/- 1 either way  Aim for 0-1 Carbs per snack if hungry  Include protein in moderation with your meals and snacks Consider having fewer egg yolks per week. Consider reading food labels for Total Carbohydrate of foods Continue your activity level daily as tolerated Consider checking blood sugar at alternate times per day if you would like immediate feedback.    Expected Outcomes:  Demonstrated interest in learning. Expect positive outcomes  Education material provided: ADA - How to Thrive: A Guide for Your Journey with Diabetes, carb counting card  If problems or questions, patient to contact team via:  Phone and MyChart  Future DSME appointment: PRN

## 2020-07-19 NOTE — Patient Instructions (Signed)
Plan:  Consider creating a relaxing nighttime routing. Adjust the light settings on your phone (blue light filter) Aim for 2-3 Carb Choices per meal (30-45 grams) +/- 1 either way  Aim for 0-1 Carbs per snack if hungry  Include protein in moderation with your meals and snacks Consider having fewer egg yolks per week. Consider reading food labels for Total Carbohydrate of foods Continue your activity level daily as tolerated Consider checking blood sugar at alternate times per day if you would like immediate feedback.

## 2020-09-05 ENCOUNTER — Encounter (INDEPENDENT_AMBULATORY_CARE_PROVIDER_SITE_OTHER): Payer: Self-pay

## 2020-09-13 ENCOUNTER — Other Ambulatory Visit: Payer: Self-pay | Admitting: Family Medicine

## 2020-09-13 DIAGNOSIS — I872 Venous insufficiency (chronic) (peripheral): Secondary | ICD-10-CM | POA: Diagnosis not present

## 2020-09-13 DIAGNOSIS — M810 Age-related osteoporosis without current pathological fracture: Secondary | ICD-10-CM

## 2020-09-13 DIAGNOSIS — L820 Inflamed seborrheic keratosis: Secondary | ICD-10-CM | POA: Diagnosis not present

## 2020-09-27 ENCOUNTER — Encounter: Payer: Self-pay | Admitting: Family Medicine

## 2020-09-27 ENCOUNTER — Other Ambulatory Visit: Payer: Self-pay

## 2020-09-27 ENCOUNTER — Ambulatory Visit: Payer: Medicare HMO | Admitting: Family Medicine

## 2020-09-27 VITALS — BP 126/74 | HR 72 | Temp 97.7°F | Ht 69.0 in | Wt 176.6 lb

## 2020-09-27 DIAGNOSIS — I10 Essential (primary) hypertension: Secondary | ICD-10-CM | POA: Diagnosis not present

## 2020-09-27 DIAGNOSIS — E785 Hyperlipidemia, unspecified: Secondary | ICD-10-CM

## 2020-09-27 DIAGNOSIS — I7789 Other specified disorders of arteries and arterioles: Secondary | ICD-10-CM | POA: Diagnosis not present

## 2020-09-27 DIAGNOSIS — E119 Type 2 diabetes mellitus without complications: Secondary | ICD-10-CM

## 2020-09-27 DIAGNOSIS — M810 Age-related osteoporosis without current pathological fracture: Secondary | ICD-10-CM

## 2020-09-27 LAB — POCT GLYCOSYLATED HEMOGLOBIN (HGB A1C): Hemoglobin A1C: 6.5 % — AB (ref 4.0–5.6)

## 2020-09-27 NOTE — Patient Instructions (Signed)
Please return in 3 months for diabetes follow up. You may cancel your appt in August please.   Great work with diet. This is working well for you.  We will consider adding a statin for the cardiovascular benefits. We want your LDL (lousy cholesterol) to be < 70; Yours is 125. Lab Results  Component Value Date   CHOL 227 (H) 06/14/2020   HDL 65.00 06/14/2020   LDLCALC 125 (H) 06/14/2020   TRIG 182.0 (H) 06/14/2020   CHOLHDL 3 06/14/2020   Lab Results  Component Value Date   HGBA1C 6.5 (A) 09/27/2020    If you have any questions or concerns, please don't hesitate to send me a message via MyChart or call the office at [phone]. Thank you for visiting with [place] today! It's our pleasure caring for you.  I have ordered a mammogram and/or bone density for you as we discussed today: []   Mammogram  [x]   Bone Density  Please call the office checked below to schedule your appointment:  [x]   [facility]      [place], [place]         []   [facility]  [address] [place], [place]

## 2020-09-27 NOTE — Progress Notes (Signed)
Subjective  CC:  Chief Complaint  Patient presents with  . Diabetes  . Hypertension    HPI: Joyce Hatfield is a 75 y.o. female who presents to the office today for follow up of diabetes and problems listed above in the chief complaint.   New onset diabetes follow up: Her diabetic control is reported as Improved.  She has completed one-on-one nutrition counseling and is monitoring her carbohydrate intake closely.  Her weight is down about 5 pounds.  She is exercising regularly, hiking often.  She feels well.  No symptoms of hyperglycemia. She denies exertional CP or SOB or symptomatic hypoglycemia. She denies foot sores or paresthesias.   Hypertension continues on ACE inhibitor remains well controlled.  No symptoms of hypotension.  No chest pain.  Hyperlipidemia: Goal LDL less than 70.  Patient is hesitant to start statin but will consider it.  She has travel planned for much of the summer but we can discuss it at her next visit.  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  No obstructive symptoms.  Osteoporosis with last bone density in 2018, had lower impact radial fracture: Has been on Fosamax since.  Due for repeat DEXA scan.   Wt Readings from Last 3 Encounters:  09/27/20 176 lb 9.6 oz (80.1 kg)  06/26/20 181 lb (82.1 kg)  06/14/20 181 lb 3.2 oz (82.2 kg)    BP Readings from Last 3 Encounters:  09/27/20 126/74  06/26/20 118/72  06/14/20 122/74    Assessment  1. New onset type 2 diabetes mellitus ([place])   2. Dyslipidemia   3. Essential hypertension   4. Arterial ectasia (HCC) Chronic  5. Osteoporosis, postmenopausal      Plan   Diabetes is currently very well controlled.  Diet controlled.  Continue with diet and exercise.  Will monitor in 3 months time.  Continue ACE inhibitor.  Dyslipidemia: LDL above goal.  Educated regarding benefits of statins.  Defer but will likely start after  her travel at her next visit.  Hypertension remains well controlled on ACE inhibitor.  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  Continue calcium, vitamin D and weightbearing exercise   Follow up: 3 months to follow-up diabetes. Orders Placed This Encounter  Procedures  . DG Bone Density  . POCT HgB A1C   No orders of the defined types were placed in this encounter.     Immunization History  Administered Date(s) Administered  . Influenza Inj Mdck Quad Pf 05/05/2018  . Influenza, High Dose Seasonal PF 02/16/2016, 02/05/2017, 02/03/2019  . Influenza-Unspecified 02/24/2020  . PFIZER(Purple Top)SARS-COV-2 Vaccination 06/05/2019, 06/30/2019, 02/04/2020  . Pneumococcal Conjugate-13 06/15/2013  . Pneumococcal Polysaccharide-23 09/28/2015  . Tdap 10/23/2006, 04/04/2017  . Zoster Recombinat (Shingrix) 10/09/2016, 12/22/2016  . Zoster, Live 04/30/2007, 03/05/2010    Diabetes Related Lab Review: Lab Results  Component Value Date   HGBA1C 6.5 (A) 09/27/2020   HGBA1C 7.2 (H) 06/14/2020    Lab Results  Component Value Date   MICROALBUR 1.0 06/26/2020   Lab Results  Component Value Date   CREATININE 0.79 06/14/2020   BUN 18 06/14/2020   NA 139 06/14/2020   K 4.0 06/14/2020   CL 100 06/14/2020   CO2 31 06/14/2020   Lab Results  Component Value Date   CHOL 227 (H) 06/14/2020   Lab Results  Component Value Date   HDL  65.00 06/14/2020   Lab Results  Component Value Date   LDLCALC 125 (H) 06/14/2020   Lab Results  Component Value Date   TRIG 182.0 (H) 06/14/2020   Lab Results  Component Value Date   CHOLHDL 3 06/14/2020   No results found for: LDLDIRECT The 10-year ASCVD risk score Mikey Bussing DC Jr., et al., 2013) is: 27.3%   Values used to calculate the score:     Age: 34 years     Sex: Female     Is Non-Hispanic African American: No     Diabetic: Yes     Tobacco smoker: No     Systolic  Blood Pressure: 126 mmHg     Is BP treated: Yes     HDL Cholesterol: 65 mg/dL     Total Cholesterol: 227 mg/dL I have reviewed the PMH, Fam and Soc history. Patient Active Problem List   Diagnosis Date Noted  . New onset type 2 diabetes mellitus ([place]) 06/26/2020    Priority: High  . Dyslipidemia 06/26/2020    Priority: High  . Hx of melanoma excision 11/13/2018    Priority: High  . OSA on CPAP 09/08/2014    Priority: High  . Essential hypertension 03/16/2014    Priority: High  . Serrated adenoma of colon 11/08/2013    Priority: High    2015 sessile serrated adenoma size ? And tubular adenoma recheck 5 yr   . Restless legs 11/12/2018    Priority: Medium  . Osteoporosis, postmenopausal 05/05/2018    Priority: Medium    Dexa has been sl osteopenic has been on fosamax for about 5 years and evista  09/25/2016 FRAX 19/3.4 ; low impact distal radius fracture. 07/27/2013 DXA FRAX 15 / 1.7     . Chronic allergic rhinitis 11/12/2018    Priority: Low  . 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  See records from [place] History: Patient  reports that she has never smoked. She has never used smokeless tobacco. She reports that she does not drink alcohol and does not use drugs.  Review of Systems: Ophthalmic: negative for eye pain, loss of vision or double vision Cardiovascular: negative for chest pain Respiratory: negative for SOB or persistent cough Gastrointestinal: negative for abdominal pain Genitourinary: negative for dysuria or gross hematuria MSK: negative for foot lesions Neurologic: negative for weakness or gait disturbance  Objective  Vitals: BP 126/74   Pulse 72   Temp 97.7 F (36.5 C) (Temporal)   Ht 5\' 9"  (1.753 m)   Wt 176 lb 9.6 oz (80.1 kg)   SpO2 97%   BMI 26.08 kg/m  General: well appearing, no acute distress  Psych:  Alert and  oriented, normal mood and affect Cardiovascular:  Nl S1 and S2, RRR without murmur, gallop or rub. no edema Respiratory:  Good breath sounds bilaterally, CTAB with normal effort, no rales Foot exam: no erythema, pallor, or cyanosis visible nl proprioception and sensation to monofilament testing bilaterally, +2 distal pulses bilaterally    Diabetic education: ongoing education regarding chronic disease management for diabetes was given today. We continue to reinforce the ABC's of diabetic management: A1c (<7 or 8 dependent upon patient), tight blood pressure control, and cholesterol management with goal LDL < 100 minimally. We discuss diet strategies, exercise recommendations, medication options and possible side effects. At each visit, we review  recommended immunizations and preventive care recommendations for diabetics and stress that good diabetic control can prevent other problems. See below for this patient's data.    Commons side effects, risks, benefits, and alternatives for medications and treatment plan prescribed today were discussed, and the patient expressed understanding of the given instructions. Patient is instructed to call or message via MyChart if he/she has any questions or concerns regarding our treatment plan. No barriers to understanding were identified. We discussed Red Flag symptoms and signs in detail. Patient expressed understanding regarding what to do in case of urgent or emergency type symptoms.   Medication list was reconciled, printed and provided to the patient in AVS. Patient instructions and summary information was reviewed with the patient as documented in the AVS. This note was prepared with assistance of Dragon voice recognition software. Occasional wrong-word or sound-a-like substitutions may have occurred due to the inherent limitations of voice recognition software  This visit occurred during the SARS-CoV-2 public health emergency.  Safety protocols were in  place, including screening questions prior to the visit, additional usage of staff PPE, and extensive cleaning of exam room while observing appropriate contact time as indicated for disinfecting solutions.

## 2020-10-10 ENCOUNTER — Telehealth (INDEPENDENT_AMBULATORY_CARE_PROVIDER_SITE_OTHER): Payer: Medicare HMO | Admitting: Family Medicine

## 2020-10-10 DIAGNOSIS — U071 COVID-19: Secondary | ICD-10-CM | POA: Diagnosis not present

## 2020-10-10 MED ORDER — MOLNUPIRAVIR EUA 200MG CAPSULE: 4.0000 | ORAL_CAPSULE | Freq: Two times a day (BID) | ORAL | 0 refills | Status: AC

## 2020-10-10 NOTE — Patient Instructions (Signed)
HOME CARE TIPS:    -I sent the medication(s) we discussed to your pharmacy: Meds ordered this encounter  Medications   molnupiravir EUA 200 mg CAPS    Sig: Take 4 capsules (800 mg total) by mouth 2 (two) times daily for 5 days.    Dispense:  40 capsule    Refill:  0     -I sent in the Covid19 treatment or referral you requested per our discussion. Please see the information provided below and discuss further with the pharmacist/treatment team.   -can use tylenol or aleve if needed for fevers, aches and pains per instructions  -can use nasal saline a few times per day if you have nasal congestion; sometimes  a short course of Afrin nasal spray for 3 days can help with symptoms as well  -stay hydrated, drink plenty of fluids and eat small healthy meals - avoid dairy  -can take 1000 IU (25mcg) Vit D3 and 100-500 mg of Vit C daily per instructions  -If the Covid test is positive, check out the CDC website for more information on home care, transmission and treatment for COVID19  -follow up with your doctor in 2-3 days unless improving and feeling better  -stay home while sick, except to seek medical care. If you have COVID19, ideally it would be best to stay home for a full 10 days since the onset of symptoms PLUS one day of no fever and feeling better. Wear a good mask that fits snugly (such as N95 or KN95) if around others to reduce the risk of transmission.  It was nice to meet you today, and I really hope you are feeling better soon. I help [facility] out with telemedicine visits on Tuesdays and Thursdays and am available for visits on those days. If you have any concerns or questions following this visit please schedule a follow up visit with your Primary Care doctor or seek care at a local urgent care clinic to avoid delays in care.    Seek in person care or schedule a follow up video visit promptly if your symptoms worsen, new concerns arise or you are not improving with  treatment. Call 911 and/or seek emergency care if your symptoms are severe or life threatening.    Fact Sheet for Patients And Caregivers Emergency Use Authorization (EUA) Of LAGEVRIOT (molnupiravir) capsules For Coronavirus Disease 2019 (COVID-19)  What is the most important information I should know about LAGEVRIO? LAGEVRIO may cause serious side effects, including: ? LAGEVRIO may cause harm to your unborn baby. It is not known if LAGEVRIO will harm your baby if you take LAGEVRIO during pregnancy. o LAGEVRIO is not recommended for use in pregnancy. o LAGEVRIO has not been studied in pregnancy. LAGEVRIO was studied in pregnant animals only. When LAGEVRIO was given to pregnant animals, LAGEVRIO caused harm to their unborn babies. o You and your healthcare provider may decide that you should take LAGEVRIO during pregnancy if there are no other COVID-19 treatment options approved or authorized by the FDA that are accessible or clinically appropriate for you. o If you and your healthcare provider decide that you should take LAGEVRIO during pregnancy, you and your healthcare provider should discuss the known and potential benefits and the potential risks of taking LAGEVRIO during pregnancy. For individuals who are able to become pregnant: ? You should use a reliable method of birth control (contraception) consistently and correctly during treatment with LAGEVRIO and for 4 days after the last dose of LAGEVRIO. Talk to   your healthcare provider about reliable birth control methods. ? Before starting treatment with LAGEVRIO your healthcare provider may do a pregnancy test to see if you are pregnant before starting treatment with LAGEVRIO. ? Tell your healthcare provider right away if you become pregnant or think you may be pregnant during treatment with LAGEVRIO. Pregnancy Surveillance Program: ? There is a pregnancy surveillance program for individuals who take LAGEVRIO during pregnancy. The  purpose of this program is to collect information about the health of you and your baby. Talk to your healthcare provider about how to take part in this program. ? If you take LAGEVRIO during pregnancy and you agree to participate in the pregnancy surveillance program and allow your healthcare provider to share your information with [employer], then your healthcare provider will report your use of LAGEVRIO during pregnancy to [employer] by calling [phone] or pregnancyreporting.msd.com. For individuals who are sexually active with partners who are able to become pregnant: ? It is not known if LAGEVRIO can affect sperm. While the risk is regarded as low, animal studies to fully assess the potential for LAGEVRIO to affect the babies of males treated with LAGEVRIO have not been completed. A reliable method of birth control (contraception) should be used consistently and correctly during treatment with LAGEVRIO and for at least 3 months after the last dose. The risk to sperm beyond 3 months is not known. Studies to understand the risk to sperm beyond 3 months are ongoing. Talk to your healthcare provider about reliable birth control methods. Talk to your healthcare provider if you have questions or concerns about how LAGEVRIO may affect sperm. You are being given this fact sheet because your healthcare provider believes it is necessary to provide you with LAGEVRIO for the treatment of adults with mild-to-moderate coronavirus disease 2019 (COVID-19) with positive results of direct SARS-CoV-2 viral testing, and who are at high risk for progression to severe COVID-19 including hospitalization or death, and for whom other COVID-19 treatment options approved or authorized by the FDA are not accessible or clinically appropriate. The U.S. Food and Drug Administration (FDA) has issued an Emergency Use Authorization (EUA) to make LAGEVRIO available during the COVID-19 pandemic  (for more details about an EUA please see "What is an Emergency Use Authorization?" at the end of this document). LAGEVRIO is not an FDA-approved medicine in the [place]. Read this Fact Sheet for information about LAGEVRIO. Talk to your healthcare provider about your options if you have any questions. It is your choice to take LAGEVRIO.  What is COVID-19? COVID-19 is caused by a virus called a coronavirus. You can get COVID-19 through close contact with another person who has the virus. COVID-19 illnesses have ranged from very mild-to-severe, including illness resulting in death. While information so far suggests that most COVID-19 illness is mild, serious illness can happen and may cause some of your other medical conditions to become worse. Older people and people of all ages with severe, long lasting (chronic) medical conditions like heart disease, lung disease and diabetes, for example seem to be at higher risk of being hospitalized for COVID-19.  What is LAGEVRIO? LAGEVRIO is an investigational medicine used to treat mild-to-moderate COVID-19 in adults: ? with positive results of direct SARS-CoV-2 viral testing, and ? who are at high risk for progression to severe COVID-19 including hospitalization or death, and for whom other COVID-19 treatment options approved or authorized by the FDA are not accessible or clinically appropriate. The   FDA has authorized the emergency use of LAGEVRIO for the treatment of mild-tomoderate COVID-19 in adults under an EUA. For more information on EUA, see the "What is an Emergency Use Authorization (EUA)?" section at the end of this Fact Sheet. LAGEVRIO is not authorized: ? for use in people less than 18 years of age. ? for prevention of COVID-19. ? for people needing hospitalization for COVID-19. ? for use for longer than 5 consecutive days.  What should I tell my healthcare provider before I take LAGEVRIO? Tell your healthcare provider if  you: ? Have any allergies ? Are breastfeeding or plan to breastfeed ? Have any serious illnesses ? Are taking any medicines (prescription, over-the-counter, vitamins, or herbal products).  How do I take LAGEVRIO? ? Take LAGEVRIO exactly as your healthcare provider tells you to take it. ? Take 4 capsules of LAGEVRIO every 12 hours (for example, at 8 am and at 8 pm) ? Take LAGEVRIO for 5 days. It is important that you complete the full 5 days of treatment with LAGEVRIO. Do not stop taking LAGEVRIO before you complete the full 5 days of treatment, even if you feel better. ? Take LAGEVRIO with or without food. ? You should stay in isolation for as long as your healthcare provider tells you to. Talk to your healthcare provider if you are not sure about how to properly isolate while you have COVID-19. ? Swallow LAGEVRIO capsules whole. Do not open, break, or crush the capsules. If you cannot swallow capsules whole, tell your healthcare provider. ? What to do if you miss a dose: o If it has been less than 10 hours since the missed dose, take it as soon as you remember o If it has been more than 10 hours since the missed dose, skip the missed dose and take your dose at the next scheduled time. ? Do not double the dose of LAGEVRIO to make up for a missed dose.  What are the important possible side effects of LAGEVRIO? ? See, "What is the most important information I should know about LAGEVRIO?" ? Allergic Reactions. Allergic reactions can happen in people taking LAGEVRIO, even after only 1 dose. Stop taking LAGEVRIO and call your healthcare provider right away if you get any of the following symptoms of an allergic reaction: o hives o rapid heartbeat o trouble swallowing or breathing o swelling of the mouth, lips, or face o throat tightness o hoarseness o skin rash The most common side effects of LAGEVRIO are: ? diarrhea ? nausea ? dizziness These are not all the possible side effects  of LAGEVRIO. Not many people have taken LAGEVRIO. Serious and unexpected side effects may happen. This medicine is still being studied, so it is possible that all of the risks are not known at this time.  What other treatment choices are there?  Veklury (remdesivir) is FDA-approved as an intravenous (IV) infusion for the treatment of mildto-moderate COVID-19 in certain adults and children. Talk with your doctor to see if Veklury is appropriate for you. Like LAGEVRIO, FDA may also allow for the emergency use of other medicines to treat people with COVID-19. Go to https://www.fda.gov/emergency-preparedness-and-response/mcm-legalregulatory-and-policy-framework/emergency-use-authorization for more information. It is your choice to be treated or not to be treated with LAGEVRIO. Should you decide not to take it, it will not change your standard medical care.  What if I am breastfeeding? Breastfeeding is not recommended during treatment with LAGEVRIO and for 4 days after the last dose of LAGEVRIO. If you are   breastfeeding or plan to breastfeed, talk to your healthcare provider about your options and specific situation before taking LAGEVRIO.  How do I report side effects with LAGEVRIO? Contact your healthcare provider if you have any side effects that bother you or do not go away. Report side effects to FDA MedWatch at www.fda.gov/medwatch or call 1-800-FDA-1088 (1- 800-332-1088).  How should I store LAGEVRIO? ? Store LAGEVRIO capsules at room temperature between 68F to 77F (20C to 25C). ? Keep LAGEVRIO and all medicines out of the reach of children and pets. How can I learn more about COVID-19? ? Ask your healthcare provider. ? Visit www.cdc.gov/COVID19 ? Contact your local or state public health department. ? Call [employer] at 1-800-672-6372 (toll free in the [place] ? Visit www.molnupiravir.com  What Is an Emergency Use Authorization (EUA)? The [place] FDA has made  LAGEVRIO available under an emergency access mechanism called an Emergency Use Authorization (EUA) The EUA is supported by a Secretary of Health and Human Service (HHS) declaration that circumstances exist to justify emergency use of drugs and biological products during the COVID-19 pandemic. LAGEVRIO for the treatment of mild-to-moderate COVID-19 in adults with positive results of direct SARS-CoV-2 viral testing, who are at high risk for progression to severe COVID-19, including hospitalization or death, and for whom alternative COVID-19 treatment options approved or authorized by FDA are not accessible or clinically appropriate, has not undergone the same type of review as an FDA-approved product. In issuing an EUA under the COVID-19 public health emergency, the FDA has determined, among other things, that based on the total amount of scientific evidence available including data from adequate and well-controlled clinical trials, if available, it is reasonable to believe that the product may be effective for diagnosing, treating, or preventing COVID-19, or a serious or life-threatening disease or condition caused by COVID-19; that the known and potential benefits of the product, when used to diagnose, treat, or prevent such disease or condition, outweigh the known and potential risks of such product; and that there are no adequate, approved, and available alternatives.  All of these criteria must be met to allow for the product to be used in the treatment of patients during the COVID-19 pandemic. The EUA for LAGEVRIO is in effect for the duration of the COVID-19 declaration justifying emergency use of LAGEVRIO, unless terminated or revoked (after which LAGEVRIO may no longer be used under the EUA). For patent information: www.msd.com/research/patent Copyright  2021-2022 [employer] [place], [place] and its affiliates. All rights reserved. [id number] Revised: March  2022  

## 2020-10-10 NOTE — Progress Notes (Signed)
Virtual Visit via Video Note  I connected with Joyce Hatfield  on 10/10/20 at  1:00 PM EDT by a video enabled telemedicine application and verified that I am speaking with the correct person using two identifiers.  Location patient: home, [place] Location provider:work or home office Persons participating in the virtual visit: patient, provider  I discussed the limitations of evaluation and management by telemedicine and the availability of in person appointments. The patient expressed understanding and agreed to proceed.   HPI:  Acute telemedicine visit for Covid19: -went on a hiking trip and got covid - exposed 5 days ago -Onset: yesterday, tested positive on a home test today -Symptoms include:sore throat, nasal congestion -Denies: CP, SOB, NVD, loss of taste or smell, inability to eat/drink/get out of bed -Pertinent past medical history: diabetic, hx cancer, HTN -Pertinent medication allergies: Allergies  Allergen Reactions   Bee Venom   -COVID-19 vaccine status: 2 doses + 2 boosters -no labs in 90 days  ROS: See pertinent positives and negatives per HPI.  Past Medical History:  Diagnosis Date   Cancer ([place]) 2008   melanoma   Osteoporosis    Sleep apnea    using CPAP since 2016    Past Surgical History:  Procedure Laterality Date   BIRTH [place]   BREAST BIOPSY Right 2010   benign   BROKEN HIP  1995   ENDOMETRIOUSIS [place]  2008     Current Outpatient Medications:    molnupiravir EUA 200 mg CAPS, Take 4 capsules (800 mg total) by mouth 2 (two) times daily for 5 days., Disp: 40 capsule, Rfl: 0   alendronate (FOSAMAX) 70 MG tablet, TAKE 1 TABLET BY MOUTH EVERY 7 DAYS. TAKE WITH A FULL GLASS OF WATER DO NOT LIE DOWN FOR THE NEXT 30 MINS, Disp: 12 tablet, Rfl: 1   Calcium Carbonate-Vitamin D 600-400 MG-UNIT tablet, Take 1 tablet by mouth daily., Disp: , Rfl:    Cholecalciferol (VITAMIN D) 1000 UNITS capsule, Take 1,000 Units by mouth  daily., Disp: , Rfl:    hydrochlorothiazide (HYDRODIURIL) 25 MG tablet, Take 1 tablet (25 mg total) by mouth daily., Disp: 90 tablet, Rfl: 3   lisinopril (PRINIVIL,ZESTRIL) 10 MG tablet, Take 10 mg by mouth daily., Disp: , Rfl:    Multiple Vitamin (MULTIVITAMIN) capsule, Take 1 capsule by mouth daily., Disp: , Rfl:    triamcinolone (KENALOG) 0.1 %, Apply 1 application topically 2 (two) times daily. (Patient not taking: Reported on 09/27/2020), Disp: , Rfl:    vitamin C (ASCORBIC ACID) 500 MG tablet, Take 500 mg by mouth daily., Disp: , Rfl:   EXAM:  VITALS per patient if applicable:  GENERAL: alert, oriented, appears well and in no acute distress  HEENT: atraumatic, conjunttiva clear, no obvious abnormalities on inspection of external nose and ears  NECK: normal movements of the head and neck  LUNGS: on inspection no signs of respiratory distress, breathing rate appears normal, no obvious gross SOB, gasping or wheezing  CV: no obvious cyanosis  MS: moves all visible extremities without noticeable abnormality  PSYCH/NEURO: pleasant and cooperative, no obvious depression or anxiety, speech and thought processing grossly intact  ASSESSMENT AND PLAN:  Discussed the following assessment and plan:  COVID-19   Discussed treatment options, ideal treatment window, potential complications, isolation and precautions for COVID-19.  After lengthy discussion, the patient opted for treatment with molnupiravir due to being higher risk for complications of covid or severe disease and other  factors. Discussed EUA status of this drug and the fact that there is preliminary limited knowledge of risks/interactions/side effects per EUA document vs possible benefits and precautions. This information was shared with patient during the visit and also was provided in patient instructions. Also, advised that patient discuss risks/interactions and use with pharmacist/treatment team as well. Other symptomatic care  measures summarized in patient instructions. Work/School slipped offered: declined Advised to seek prompt in person care if worsening, new symptoms arise, or if is not improving with treatment. Discussed options for inperson care if PCP office not available. Did let this patient know that I only do telemedicine on Tuesdays and Thursdays for [place]. Advised to schedule follow up visit with PCP or [facility] if any further questions or concerns to avoid delays in care.   I discussed the assessment and treatment plan with the patient. The patient was provided an opportunity to ask questions and all were answered. The patient agreed with the plan and demonstrated an understanding of the instructions.     Lucretia Kern, DO

## 2020-11-09 ENCOUNTER — Ambulatory Visit
Admission: RE | Admit: 2020-11-09 | Discharge: 2020-11-09 | Disposition: A | Discharge status: Home / Self Care | Payer: Medicare HMO | Source: Ambulatory Visit | Attending: Family Medicine | Admitting: Family Medicine

## 2020-11-09 ENCOUNTER — Other Ambulatory Visit: Payer: Self-pay

## 2020-11-09 DIAGNOSIS — M8589 Other specified disorders of bone density and structure, multiple sites: Secondary | ICD-10-CM | POA: Diagnosis not present

## 2020-11-09 DIAGNOSIS — M810 Age-related osteoporosis without current pathological fracture: Secondary | ICD-10-CM

## 2020-11-09 DIAGNOSIS — Z780 Asymptomatic menopausal state: Secondary | ICD-10-CM | POA: Diagnosis not present

## 2020-11-10 ENCOUNTER — Telehealth: Payer: Self-pay | Admitting: Family Medicine

## 2020-11-10 NOTE — Telephone Encounter (Signed)
Copied from [place] [id number]. Topic: Medicare AWV >> Nov 10, 2020  9:26 AM Harris-Coley, Hannah Beat wrote: Reason for CRM: Left message for patient to schedule Annual Wellness Visit.  Please schedule with Nurse Health Advisor Charlott Rakes, RN at [facility].

## 2020-11-20 ENCOUNTER — Ambulatory Visit: Payer: Medicare HMO | Admitting: Family Medicine

## 2020-12-11 ENCOUNTER — Other Ambulatory Visit: Payer: Self-pay | Admitting: Family Medicine

## 2020-12-11 DIAGNOSIS — I10 Essential (primary) hypertension: Secondary | ICD-10-CM

## 2020-12-14 ENCOUNTER — Encounter: Payer: Medicare HMO | Admitting: Family Medicine

## 2020-12-15 ENCOUNTER — Encounter: Payer: Medicare HMO | Admitting: Family Medicine

## 2021-01-03 ENCOUNTER — Encounter: Payer: Self-pay | Admitting: Family Medicine

## 2021-01-03 ENCOUNTER — Ambulatory Visit (INDEPENDENT_AMBULATORY_CARE_PROVIDER_SITE_OTHER): Payer: Medicare HMO | Admitting: Family Medicine

## 2021-01-03 ENCOUNTER — Other Ambulatory Visit: Payer: Self-pay

## 2021-01-03 VITALS — BP 132/82 | HR 66 | Temp 97.4°F | Ht 69.0 in | Wt 175.0 lb

## 2021-01-03 DIAGNOSIS — M810 Age-related osteoporosis without current pathological fracture: Secondary | ICD-10-CM | POA: Diagnosis not present

## 2021-01-03 DIAGNOSIS — Z9889 Other specified postprocedural states: Secondary | ICD-10-CM

## 2021-01-03 DIAGNOSIS — G2581 Restless legs syndrome: Secondary | ICD-10-CM

## 2021-01-03 DIAGNOSIS — D126 Benign neoplasm of colon, unspecified: Secondary | ICD-10-CM

## 2021-01-03 DIAGNOSIS — E1169 Type 2 diabetes mellitus with other specified complication: Secondary | ICD-10-CM

## 2021-01-03 DIAGNOSIS — E119 Type 2 diabetes mellitus without complications: Secondary | ICD-10-CM

## 2021-01-03 DIAGNOSIS — Z0000 Encounter for general adult medical examination without abnormal findings: Secondary | ICD-10-CM | POA: Diagnosis not present

## 2021-01-03 DIAGNOSIS — E785 Hyperlipidemia, unspecified: Secondary | ICD-10-CM

## 2021-01-03 DIAGNOSIS — Z85820 Personal history of malignant melanoma of skin: Secondary | ICD-10-CM

## 2021-01-03 DIAGNOSIS — I10 Essential (primary) hypertension: Secondary | ICD-10-CM | POA: Diagnosis not present

## 2021-01-03 LAB — COMPREHENSIVE METABOLIC PANEL
ALT: 12 U/L (ref 0–35)
AST: 15 U/L (ref 0–37)
Albumin: 4.1 g/dL (ref 3.5–5.2)
Alkaline Phosphatase: 70 U/L (ref 39–117)
BUN: 19 mg/dL (ref 6–23)
CO2: 30 mEq/L (ref 19–32)
Calcium: 9.5 mg/dL (ref 8.4–10.5)
Chloride: 102 mEq/L (ref 96–112)
Creatinine, Ser: 0.77 mg/dL (ref 0.40–1.20)
GFR: 76.9 mL/min (ref >60.00)
Glucose, Bld: 151 mg/dL — ABNORMAL HIGH (ref 70–99)
Potassium: 4.1 mEq/L (ref 3.5–5.1)
Sodium: 139 mEq/L (ref 135–145)
Total Bilirubin: 1.6 mg/dL — ABNORMAL HIGH (ref 0.2–1.2)
Total Protein: 6.5 g/dL (ref 6.0–8.3)

## 2021-01-03 LAB — CBC WITH DIFFERENTIAL/PLATELET
Basophils Absolute: 0 10*3/uL (ref 0.0–0.1)
Basophils Relative: 0.6 % (ref 0.0–3.0)
Eosinophils Absolute: 0.1 10*3/uL (ref 0.0–0.7)
Eosinophils Relative: 1.9 % (ref 0.0–5.0)
HCT: 42.1 % (ref 36.0–46.0)
Hemoglobin: 14.7 g/dL (ref 12.0–15.0)
Lymphocytes Relative: 26.4 % (ref 12.0–46.0)
Lymphs Abs: 1.5 10*3/uL (ref 0.7–4.0)
MCHC: 35 g/dL (ref 30.0–36.0)
MCV: 85.8 fl (ref 78.0–100.0)
Monocytes Absolute: 0.3 10*3/uL (ref 0.1–1.0)
Monocytes Relative: 5.8 % (ref 3.0–12.0)
Neutro Abs: 3.6 10*3/uL (ref 1.4–7.7)
Neutrophils Relative %: 65.3 % (ref 43.0–77.0)
Platelets: 215 10*3/uL (ref 150.0–400.0)
RBC: 4.9 Mil/uL (ref 3.87–5.11)
RDW: 13.4 % (ref 11.5–15.5)
WBC: 5.5 10*3/uL (ref 4.0–10.5)

## 2021-01-03 LAB — LIPID PANEL
Cholesterol: 215 mg/dL — ABNORMAL HIGH (ref 0–200)
HDL: 63.3 mg/dL (ref >39.00)
LDL Cholesterol: 131 mg/dL — ABNORMAL HIGH (ref 0–99)
NonHDL: 152.01
Total CHOL/HDL Ratio: 3
Triglycerides: 107 mg/dL (ref 0.0–149.0)
VLDL: 21.4 mg/dL (ref 0.0–40.0)

## 2021-01-03 LAB — POCT GLYCOSYLATED HEMOGLOBIN (HGB A1C): Hemoglobin A1C: 6.9 % — AB (ref 4.0–5.6)

## 2021-01-03 MED ORDER — ROSUVASTATIN CALCIUM 5 MG PO TABS: 5.0000 mg | ORAL_TABLET | Freq: Every evening | ORAL | 3 refills | Status: DC

## 2021-01-03 NOTE — Progress Notes (Signed)
Subjective  Chief Complaint  Patient presents with   Annual Exam    Fasting   Hypertension   dyslipidemia   Diabetes    HPI: Joyce Hatfield is a 75 y.o. female who presents to [place] at [place] today for a Female Wellness Visit. She also has the concerns and/or needs as listed above in the chief complaint. These will be addressed in addition to the Health Maintenance Visit.   Wellness Visit: annual visit with health maintenance review and exam without Pap  Health maintenance: Mammogram due next month.  Eye exam due next month.  Colonoscopy and bone density are up-to-date.  Reviewed bone density findings of osteopenia.  Has been treated for osteoporosis and restarted Fosamax in 2018.  Tolerating well.  She remains active.   She defers flu vaccination until October and will get at her pharmacy.  She would like the COVID fourth booster, defers till late October.  Had [place] in June, mild case and was treated with molnupiravir.  She has fully recovered. Chronic disease f/u and/or acute problem visit: (deemed necessary to be done in addition to the wellness visit): Diabetes, diet controlled: Continue to do well.  No symptoms of hyperglycemia.  Due eye exam in November.  No history of retinopathy.  No paresthesias or foot sores.  Weight is stable. Hyperlipidemia: Willing to try statin.  Has never used before.  Worries about myalgias.  Goal LDL less than 70. Hypertension controlled on ACE inhibitor and HCTZ.  No chest pain shortness of breath lower extremity edema or palpitations.  Normal renal function.  Negative urine microalbuminuria Osteoporosis: Most recent bone density in December showed osteopenia.  Slight improvement are stable.  Continues on Fosamax weekly.  Restarted in 2018. Restless legs, history of melanoma and history of serrated adenoma: No new symptoms.  All well controlled.  Screens are up-to-date.  Sees dermatology.  Assessment  1. Annual physical exam   2.  New onset type 2 diabetes mellitus ([place])   3. Dyslipidemia associated with type 2 diabetes mellitus ([place])   4. Essential hypertension   5. Osteoporosis, postmenopausal   6. Restless legs   7. Hx of melanoma excision   8. Serrated adenoma of colon      Plan  Female Wellness Visit: Age appropriate Health Maintenance and Prevention measures were discussed with patient. Included topics are cancer screening recommendations, ways to keep healthy (see AVS) including dietary and exercise recommendations, regular eye and dental care, use of seat belts, and avoidance of moderate alcohol use and tobacco use.  Screens are up-to-date.  Mammogram due next month.  Patient to schedule BMI: discussed patient's BMI and encouraged positive lifestyle modifications to help get to or maintain a target BMI. HM needs and immunizations were addressed and ordered. See below for orders. See HM and immunization section for updates.  Defers flu shot and COVID fourth booster until October.  Counseling given. Routine labs and screening tests ordered including cmp, cbc and lipids where appropriate. Discussed recommendations regarding Vit D and calcium supplementation (see AVS)  Chronic disease management visit and/or acute problem visit: Diabetes, diet controlled: Well-controlled.  Continue diabetic diet and physical activity.  Recheck in 6 months.  Recommend eye exam which is due in November.  No history of retinopathy. Hyperlipidemia: Counseled on benefits of statins.  To start Crestor 5 mg 3 times weekly and titrate up as tolerated.  Would like to get LDL below 70.  Recheck 6 months. Hypertension is well controlled.  Continue lisinopril and HCTZ 10/25 daily.  Recheck renal function and electrolytes. Osteoporosis: Discussed management.  We will treat with Fosamax for a total of 5 years.  This is her second course.  She may stop next fall.  We will repeat bone density in 2 to 3 years. Colon cancer screening history of  adenoma: Colonoscopy up-to-date. Restless legs: Symptoms resolved with iron repletion.  Monitor.  Follow up: Return in about 6 months (around 07/03/2021) for follow up of diabetes and hypertension, follow up hypercholesterolemia.  Orders Placed This Encounter  Procedures   Lipid panel   Comp Met (CMET)   CBC with Differential/Platelet   POCT HgB A1C   Meds ordered this encounter  Medications   rosuvastatin (CRESTOR) 5 MG tablet    Sig: Take 1 tablet (5 mg total) by mouth at bedtime.    Dispense:  90 tablet    Refill:  3      Body mass index is 25.84 kg/m. Wt Readings from Last 3 Encounters:  01/03/21 175 lb (79.4 kg)  09/27/20 176 lb 9.6 oz (80.1 kg)  06/26/20 181 lb (82.1 kg)     Patient Active Problem List   Diagnosis Date Noted   New onset type 2 diabetes mellitus ([place]) 06/26/2020    Priority: High   Dyslipidemia associated with type 2 diabetes mellitus ([place]) 06/26/2020    Priority: High   Hx of melanoma excision 11/13/2018    Priority: High   OSA on CPAP 09/08/2014    Priority: High   Essential hypertension 03/16/2014    Priority: High   Serrated adenoma of colon 11/08/2013    Priority: High    2015 sessile serrated adenoma size ? And tubular adenoma recheck 5 yr    Restless legs 11/12/2018    Priority: Medium   Osteoporosis, postmenopausal 05/05/2018    Priority: Medium    Dexa has been sl osteopenic. Was treated with fosamax x 5 years, then drug holiday, then restarted 2020. Plan to continue to 2025, then stop.  10/2020: T = -2.4 femur. Osteopenia.  09/25/2016 FRAX 19/3.4 ; low impact distal radius fracture. 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  See records from [facility] Maintenance  Topic Date Due   INFLUENZA VACCINE  11/27/2020    OPHTHALMOLOGY EXAM  02/27/2021   MAMMOGRAM  04/05/2021   HEMOGLOBIN A1C  07/03/2021   FOOT EXAM  09/27/2021   DEXA SCAN  11/10/2023   COLONOSCOPY (Pts 45-41yr Insurance coverage will need to be confirmed)  01/29/2024   TETANUS/TDAP  04/05/2027   COVID-19 Vaccine  Completed   PNA vac Low Risk Adult  Completed   Zoster Vaccines- Shingrix  Completed   HPV VACCINES  Aged Out   Hepatitis C Screening  Discontinued   Immunization History  Administered Date(s) Administered   Influenza Inj Mdck Quad Pf 05/05/2018   Influenza, High Dose Seasonal PF 02/16/2016, 02/05/2017, 02/03/2019   Influenza-Unspecified 02/24/2020   PFIZER(Purple Top)SARS-COV-2 Vaccination 06/05/2019, 06/30/2019, 02/04/2020, 09/05/2020   Pneumococcal Conjugate-13 06/15/2013   Pneumococcal Polysaccharide-23 09/28/2015   Tdap 10/23/2006, 04/04/2017   Zoster Recombinat (Shingrix) 10/09/2016, 12/22/2016   Zoster, Live 04/30/2007, 03/05/2010   We updated and reviewed the patient's past history in detail and it  is documented below. Allergies: Patient is allergic to bee venom. Past Medical History Patient  has a past medical history of Cancer ([place]) (2008), Diabetes mellitus without complication ([place]), Hyperlipidemia, Hypertension, Osteoporosis, and Sleep apnea. Past Surgical History Patient  has a past surgical history that includes BIRTH [place] (1957); BROKEN HIP (1995); MELANOMA REMOVED (2008); ENDOMETRIOUSIS LAPAROSCOPY (1985); and Breast biopsy (Right, 2010). Family History: Patient family history includes Breast cancer in her paternal aunt; Cancer in her mother; Diabetes in her brother; Early death in her paternal grandmother; Heart disease in her father and paternal grandfather; Hypertension in her father and mother. Social History:  Patient  reports that she has never smoked. She has never used smokeless tobacco. She reports that she does not drink alcohol and does not use drugs.  Review of  Systems: Constitutional: negative for fever or malaise Ophthalmic: negative for photophobia, double vision or loss of vision Cardiovascular: negative for chest pain, dyspnea on exertion, or new LE swelling Respiratory: negative for SOB or persistent cough Gastrointestinal: negative for abdominal pain, change in bowel habits or melena Genitourinary: negative for dysuria or gross hematuria, no abnormal uterine bleeding or disharge Musculoskeletal: negative for new gait disturbance or muscular weakness Integumentary: negative for new or persistent rashes, no breast lumps Neurological: negative for TIA or stroke symptoms Psychiatric: negative for SI or delusions Allergic/Immunologic: negative for hives  Patient Care Team    Relationship Specialty Notifications Start End  Leamon Arnt, MD PCP - General Family Medicine  06/14/20     Objective  Vitals: BP 132/82   Pulse 66   Temp (!) 97.4 F (36.3 C)   Ht _0  (1.753 m)   Wt 175 lb (79.4 kg)   SpO2 97%   BMI 25.84 kg/m  General:  Well developed, well nourished, no acute distress  Psych:  Alert and orientedx3,normal mood and affect HEENT:  Normocephalic, atraumatic, non-icteric sclera,  supple neck without adenopathy, mass or thyromegaly Cardiovascular:  Normal S1, S2, RRR without gallop, rub or murmur Respiratory:  Good breath sounds bilaterally, CTAB with normal respiratory effort Gastrointestinal: normal bowel sounds, soft, non-tender, no noted masses. No HSM MSK: no deformities, contusions. Joints are without erythema or swelling.  Skin:  Warm, no rashes or suspicious lesions noted Neurologic:    Mental status is normal. CN 2-11 are normal. Gross motor and sensory exams are normal. Normal gait. No tremor  Lab Results  Component Value Date   HGBA1C 6.9 (A) 01/03/2021     Commons side effects, risks, benefits, and alternatives for medications and treatment plan prescribed today were discussed, and the patient expressed  understanding of the given instructions. Patient is instructed to call or message via MyChart if he/she has any questions or concerns regarding our treatment plan. No barriers to understanding were identified. We discussed Red Flag symptoms and signs in detail. Patient expressed understanding regarding what to do in case of urgent or emergency type symptoms.  Medication list was reconciled, printed and provided to the patient in AVS. Patient instructions and summary information was reviewed with the patient as documented in the AVS. This note was prepared with assistance of Dragon voice recognition software. Occasional wrong-word or sound-a-like substitutions may have occurred due to the inherent limitations of voice recognition software  This visit occurred during the SARS-CoV-2 public health emergency.  Safety protocols were in place, including screening questions prior to the visit, additional usage of staff PPE, and extensive cleaning of exam room while observing appropriate contact time  as indicated for disinfecting solutions.

## 2021-01-03 NOTE — Patient Instructions (Addendum)
Please return in 6 months for diabetes, blood pressure and cholesterol recheck.   I will release your lab results to you on your MyChart account with further instructions. Please reply with any questions.    If you have any questions or concerns, please don't hesitate to send me a message via MyChart or call the office at [phone]. Thank you for visiting with [place] today! It's our pleasure caring for you.   Please start the crestor '5mg'$  3x/week and increase to nightly if tolerated.   Please set up an appointment for a diabetic eye exam and have the results sent to me. It is due in November along with your mammogram.

## 2021-01-04 ENCOUNTER — Encounter: Payer: Self-pay | Admitting: Family Medicine

## 2021-01-08 DIAGNOSIS — G4733 Obstructive sleep apnea (adult) (pediatric): Secondary | ICD-10-CM | POA: Diagnosis not present

## 2021-02-21 ENCOUNTER — Other Ambulatory Visit: Payer: Self-pay | Admitting: Family Medicine

## 2021-02-21 DIAGNOSIS — Z1231 Encounter for screening mammogram for malignant neoplasm of breast: Secondary | ICD-10-CM

## 2021-02-26 ENCOUNTER — Encounter: Payer: Self-pay | Admitting: Family Medicine

## 2021-03-01 ENCOUNTER — Other Ambulatory Visit: Payer: Self-pay | Admitting: Family Medicine

## 2021-03-01 DIAGNOSIS — M810 Age-related osteoporosis without current pathological fracture: Secondary | ICD-10-CM

## 2021-03-05 DIAGNOSIS — E119 Type 2 diabetes mellitus without complications: Secondary | ICD-10-CM | POA: Diagnosis not present

## 2021-03-05 DIAGNOSIS — H2513 Age-related nuclear cataract, bilateral: Secondary | ICD-10-CM | POA: Diagnosis not present

## 2021-03-05 DIAGNOSIS — H5203 Hypermetropia, bilateral: Secondary | ICD-10-CM | POA: Diagnosis not present

## 2021-03-05 LAB — HM DIABETES EYE EXAM

## 2021-03-06 ENCOUNTER — Encounter: Payer: Self-pay | Admitting: Family Medicine

## 2021-03-07 DIAGNOSIS — D225 Melanocytic nevi of trunk: Secondary | ICD-10-CM | POA: Diagnosis not present

## 2021-03-07 DIAGNOSIS — Z08 Encounter for follow-up examination after completed treatment for malignant neoplasm: Secondary | ICD-10-CM | POA: Diagnosis not present

## 2021-03-07 DIAGNOSIS — Z1283 Encounter for screening for malignant neoplasm of skin: Secondary | ICD-10-CM | POA: Diagnosis not present

## 2021-03-07 DIAGNOSIS — Z85820 Personal history of malignant melanoma of skin: Secondary | ICD-10-CM | POA: Diagnosis not present

## 2021-03-18 DIAGNOSIS — J029 Acute pharyngitis, unspecified: Secondary | ICD-10-CM | POA: Diagnosis not present

## 2021-03-18 DIAGNOSIS — B370 Candidal stomatitis: Secondary | ICD-10-CM | POA: Diagnosis not present

## 2021-03-26 ENCOUNTER — Encounter: Payer: Self-pay | Admitting: Family

## 2021-03-26 ENCOUNTER — Telehealth (INDEPENDENT_AMBULATORY_CARE_PROVIDER_SITE_OTHER): Payer: Medicare HMO | Admitting: Family

## 2021-03-26 VITALS — Ht 69.0 in | Wt 175.0 lb

## 2021-03-26 DIAGNOSIS — U071 COVID-19: Secondary | ICD-10-CM | POA: Diagnosis not present

## 2021-03-26 MED ORDER — MOLNUPIRAVIR EUA 200MG CAPSULE: 4.0000 | ORAL_CAPSULE | Freq: Two times a day (BID) | ORAL | 0 refills | Status: AC

## 2021-03-26 NOTE — Progress Notes (Signed)
MyChart Video Visit    Virtual Visit via Video Note   This visit type was conducted due to national recommendations for restrictions regarding the COVID-19 Pandemic (e.g. social distancing) in an effort to limit this patient's exposure and mitigate transmission in our community. This patient is at least at moderate risk for complications without adequate follow up. This format is felt to be most appropriate for this patient at this time. Physical exam was limited by quality of the video and audio technology used for the visit. CMA was able to get the patient set up on a video visit.  Patient location: Home. Patient and provider in visit Provider location: Office    Subjective:     Patient ID: Joyce Hatfield, female    DOB: 06-22-1945, 75 y.o.   MRN: [id number]  Chief Complaint  Patient presents with   Covid Positive    Symptoms started Sunday. Tested this morning.    Nasal Congestion   Sore Throat    Has not taken any OTC medications.    HPI: Upper Respiratory Infection: Symptoms include nasal congestion and sore throat.  Onset of symptoms was 1 day ago, unchanged since that time. She is drinking moderate amounts of fluids. Evaluation to date: none.  Treatment to date: none. Reports positive Covid test this am. Last  covid infection was 09/2020, treated with Molnupiravir and tolerated.. Pt has had recent covid booster 2 months ago.    There are no preventive care reminders to display for this patient.  Past Medical History:  Diagnosis Date   Cancer ([place]) 2008   melanoma   Diabetes mellitus without complication ([place])    Hyperlipidemia    Hypertension    Osteoporosis    Sleep apnea    using CPAP since 2016    Past Surgical History:  Procedure Laterality Date   BIRTH [place]   BREAST BIOPSY Right 2010   benign   [place]  2008    Outpatient Medications Prior to Visit  Medication Sig  Dispense Refill   alendronate (FOSAMAX) 70 MG tablet TAKE 1 TABLET BY MOUTH EVERY 7 DAYS. TAKE WITH A FULL GLASS OF WATER DO NOT LIE DOWN FOR THE NEXT 30 MINS 12 tablet 1   Calcium Carbonate-Vitamin D 600-400 MG-UNIT tablet Take 1 tablet by mouth daily.     Cholecalciferol (VITAMIN D) 1000 UNITS capsule Take 1,000 Units by mouth daily.     ferrous sulfate 325 (65 FE) MG tablet Take by mouth.     fexofenadine (ALLEGRA) 180 MG tablet Take by mouth.     hydrochlorothiazide (HYDRODIURIL) 25 MG tablet Take 1 tablet (25 mg total) by mouth daily. 90 tablet 3   lisinopril (ZESTRIL) 10 MG tablet TAKE 1 TABLET BY MOUTH EVERY DAY 90 tablet 1   Multiple Vitamin (MULTIVITAMIN) capsule Take 1 capsule by mouth daily.     nystatin (MYCOSTATIN) 100000 UNIT/ML suspension One teaspoon swish and spit four times daily     rosuvastatin (CRESTOR) 5 MG tablet Take 1 tablet (5 mg total) by mouth at bedtime. 90 tablet 3   vitamin C (ASCORBIC ACID) 500 MG tablet Take 500 mg by mouth daily.     triamcinolone (KENALOG) 0.1 % Apply 1 application topically 2 (two) times daily. (Patient not taking: Reported on 03/26/2021)     No facility-administered medications prior to visit.    Allergies  Allergen Reactions   Bee  Venom         Objective:    Physical Exam Vitals and nursing note reviewed.  Constitutional:      General: She is not in acute distress.    Appearance: Normal appearance.  HENT:     Head: Normocephalic.  Pulmonary:     Effort: No respiratory distress.  Musculoskeletal:     Cervical back: Normal range of motion.  Skin:    General: Skin is dry.     Coloration: Skin is not pale.  Neurological:     Mental Status: She is alert and oriented to person, place, and time.  Psychiatric:        Mood and Affect: Mood normal.    Ht 5\' 9"  (1.753 m)   Wt 175 lb 0.7 oz (79.4 kg)   BMI 25.85 kg/m  Wt Readings from Last 3 Encounters:  03/26/21 175 lb 0.7 oz (79.4 kg)  01/03/21 175 lb (79.4 kg)   09/27/20 176 lb 9.6 oz (80.1 kg)       Assessment & Plan:   Problem List Items Addressed This Visit       Other   COVID - Primary    Last infection 09/2020, took [place] and tolerated, recovered quickly. Has since started Crestor, GFR wnl, will resend Molnupiravir. Advised of CDC guidelines for self isolation/ ending isolation.  Advised of safe practice guidelines. Symptom Tier reviewed.  Encouraged to monitor for any worsening symptoms; watch for increased shortness of breath, weakness, and signs of dehydration. Advised when to seek emergency care.  Instructed to rest and hydrate well.  Advised to leave the house during recommended isolation period, only if it is necessary to seek medical care       Relevant Medications   nystatin (MYCOSTATIN) 100000 UNIT/ML suspension   molnupiravir EUA (LAGEVRIO) 200 mg CAPS capsule    Meds ordered this encounter  Medications   molnupiravir EUA (LAGEVRIO) 200 mg CAPS capsule    Sig: Take 4 capsules (800 mg total) by mouth 2 (two) times daily for 5 days.    Dispense:  40 capsule    Refill:  0    Order Specific Question:   Supervising Provider    Answer:   ANDY, CAMILLE L [[id number]]   I discussed the assessment and treatment plan with the patient. The patient was provided an opportunity to ask questions and all were answered. The patient agreed with the plan and demonstrated an understanding of the instructions.   The patient was advised to call back or seek an in-person evaluation if the symptoms worsen or if the condition fails to improve as anticipated.  I provided 22 minutes of face-to-face time during this encounter.

## 2021-03-26 NOTE — Assessment & Plan Note (Signed)
Last infection 09/2020, took Molnupiravir and tolerated, recovered quickly. Has since started Crestor, GFR wnl, will resend Molnupiravir. Advised of CDC guidelines for self isolation/ ending isolation.  Advised of safe practice guidelines. Symptom Tier reviewed.  Encouraged to monitor for any worsening symptoms; watch for increased shortness of breath, weakness, and signs of dehydration. Advised when to seek emergency care.  Instructed to rest and hydrate well.  Advised to leave the house during recommended isolation period, only if it is necessary to seek medical care

## 2021-03-30 ENCOUNTER — Encounter: Payer: Self-pay | Admitting: Family Medicine

## 2021-04-03 ENCOUNTER — Other Ambulatory Visit: Payer: Self-pay

## 2021-04-03 ENCOUNTER — Other Ambulatory Visit: Payer: Self-pay | Admitting: Family Medicine

## 2021-04-03 DIAGNOSIS — M7989 Other specified soft tissue disorders: Secondary | ICD-10-CM

## 2021-05-15 ENCOUNTER — Other Ambulatory Visit: Payer: Medicare HMO

## 2021-05-15 ENCOUNTER — Ambulatory Visit
Admission: RE | Admit: 2021-05-15 | Discharge: 2021-05-15 | Disposition: A | Discharge status: Home / Self Care | Payer: Medicare HMO | Source: Ambulatory Visit | Attending: Family Medicine | Admitting: Family Medicine

## 2021-05-15 DIAGNOSIS — N6489 Other specified disorders of breast: Secondary | ICD-10-CM | POA: Diagnosis not present

## 2021-05-15 DIAGNOSIS — R2232 Localized swelling, mass and lump, left upper limb: Secondary | ICD-10-CM | POA: Diagnosis not present

## 2021-05-15 DIAGNOSIS — M7989 Other specified soft tissue disorders: Secondary | ICD-10-CM

## 2021-05-15 DIAGNOSIS — R922 Inconclusive mammogram: Secondary | ICD-10-CM | POA: Diagnosis not present

## 2021-06-04 ENCOUNTER — Other Ambulatory Visit: Payer: Self-pay

## 2021-06-04 ENCOUNTER — Other Ambulatory Visit: Payer: Self-pay | Admitting: Family Medicine

## 2021-06-04 MED ORDER — HYDROCHLOROTHIAZIDE 25 MG PO TABS: 25.0000 mg | ORAL_TABLET | Freq: Every day | ORAL | 3 refills | Status: DC

## 2021-06-04 MED ORDER — ROSUVASTATIN CALCIUM 5 MG PO TABS: 5.0000 mg | ORAL_TABLET | Freq: Every evening | ORAL | 3 refills | Status: DC

## 2021-06-11 ENCOUNTER — Other Ambulatory Visit: Payer: Self-pay | Admitting: Family Medicine

## 2021-06-11 DIAGNOSIS — I10 Essential (primary) hypertension: Secondary | ICD-10-CM

## 2021-06-18 DIAGNOSIS — R69 Illness, unspecified: Secondary | ICD-10-CM | POA: Diagnosis not present

## 2021-06-23 DIAGNOSIS — R69 Illness, unspecified: Secondary | ICD-10-CM | POA: Diagnosis not present

## 2021-07-04 ENCOUNTER — Other Ambulatory Visit: Payer: Self-pay

## 2021-07-04 ENCOUNTER — Ambulatory Visit (INDEPENDENT_AMBULATORY_CARE_PROVIDER_SITE_OTHER): Payer: Medicare HMO | Admitting: Family Medicine

## 2021-07-04 ENCOUNTER — Encounter: Payer: Self-pay | Admitting: Family Medicine

## 2021-07-04 VITALS — BP 140/82 | HR 74 | Temp 97.6°F | Ht 69.0 in | Wt 164.6 lb

## 2021-07-04 DIAGNOSIS — I7789 Other specified disorders of arteries and arterioles: Secondary | ICD-10-CM | POA: Diagnosis not present

## 2021-07-04 DIAGNOSIS — I10 Essential (primary) hypertension: Secondary | ICD-10-CM

## 2021-07-04 DIAGNOSIS — E785 Hyperlipidemia, unspecified: Secondary | ICD-10-CM

## 2021-07-04 DIAGNOSIS — E1169 Type 2 diabetes mellitus with other specified complication: Secondary | ICD-10-CM

## 2021-07-04 DIAGNOSIS — E11649 Type 2 diabetes mellitus with hypoglycemia without coma: Secondary | ICD-10-CM | POA: Diagnosis not present

## 2021-07-04 LAB — COMPREHENSIVE METABOLIC PANEL
ALT: 11 U/L (ref 0–35)
AST: 15 U/L (ref 0–37)
Albumin: 4.3 g/dL (ref 3.5–5.2)
Alkaline Phosphatase: 111 U/L (ref 39–117)
BUN: 24 mg/dL — ABNORMAL HIGH (ref 6–23)
CO2: 28 mEq/L (ref 19–32)
Calcium: 9.9 mg/dL (ref 8.4–10.5)
Chloride: 99 mEq/L (ref 96–112)
Creatinine, Ser: 0.7 mg/dL (ref 0.40–1.20)
GFR: 85.92 mL/min (ref >60.00)
Glucose, Bld: 275 mg/dL — ABNORMAL HIGH (ref 70–99)
Potassium: 4.5 mEq/L (ref 3.5–5.1)
Sodium: 136 mEq/L (ref 135–145)
Total Bilirubin: 1.7 mg/dL — ABNORMAL HIGH (ref 0.2–1.2)
Total Protein: 6.3 g/dL (ref 6.0–8.3)

## 2021-07-04 LAB — MICROALBUMIN / CREATININE URINE RATIO
Creatinine,U: 66 mg/dL
Microalb Creat Ratio: 3.6 mg/g (ref 0.0–30.0)
Microalb, Ur: 2.4 mg/dL — ABNORMAL HIGH (ref 0.0–1.9)

## 2021-07-04 LAB — LIPID PANEL
Cholesterol: 169 mg/dL (ref 0–200)
HDL: 80.2 mg/dL (ref >39.00)
LDL Cholesterol: 73 mg/dL (ref 0–99)
NonHDL: 88.94
Total CHOL/HDL Ratio: 2
Triglycerides: 82 mg/dL (ref 0.0–149.0)
VLDL: 16.4 mg/dL (ref 0.0–40.0)

## 2021-07-04 LAB — POCT GLYCOSYLATED HEMOGLOBIN (HGB A1C): Hemoglobin A1C: 10.8 % — AB (ref 4.0–5.6)

## 2021-07-04 MED ORDER — FREESTYLE LIBRE 14 DAY READER DEVI: 0 refills | Status: DC

## 2021-07-04 MED ORDER — FREESTYLE LIBRE 3 SENSOR MISC: 1.0000 | 11 refills | Status: DC

## 2021-07-04 NOTE — Patient Instructions (Signed)
Please return in 3 months for diabetes follow up  ? ?You may download the [employer] app to use with your sensor OR you may purchase the reader.  ? ?Let me know if you want to see a diabetic nutritionist.  ? ?I will release your lab results to you on your MyChart account with further instructions. You may see the results before I do, but when I review them I will send you a message with my report or have my assistant call you if things need to be discussed. Please reply to my message with any questions. Thank you!  ? ?Lab Results  ?Component Value Date  ? HGBA1C 10.8 (A) 07/04/2021  ? HGBA1C 6.9 (A) 01/03/2021  ? HGBA1C 6.5 (A) 09/27/2020  ?  ? ? ?If you have any questions or concerns, please don't hesitate to send me a message via MyChart or call the office at [phone]. Thank you for visiting with [place] today! It's our pleasure caring for you.  ? ?Diabetes Mellitus and Standards of Medical Care ?Living with and managing diabetes (diabetes mellitus) can be complicated. Your diabetes treatment may be managed by a team of health care providers, including: ?A physician who specializes in diabetes (endocrinologist). You might also have visits with a nurse practitioner or physician assistant. ?Nurses. ?A registered dietitian. ?A certified diabetes care and education specialist. ?An exercise specialist. ?A pharmacist. ?An eye doctor. ?A foot specialist (podiatrist). ?A dental care provider. ?A primary care provider. ?A mental health care provider. ?How to manage your diabetes ?You can do many things to successfully manage your diabetes. Your health care providers will follow guidelines to help you get the best quality of care. Here are general guidelines for your diabetes management plan. Your health care providers may give you more specific instructions. ?Physical exams ?When you are diagnosed with diabetes, and each year after that, your health care provider will ask about your medical and family history. You  will have a physical exam, which may include: ?Measuring your height, weight, and body mass index (BMI). ?Checking your blood pressure. This will be done at every routine medical visit. Your target blood pressure may vary depending on your medical conditions, your age, and other factors. ?A thyroid exam. ?A skin exam. ?Screening for nerve damage (peripheral neuropathy). This may include checking the pulse in your legs and feet and the level of sensation in your hands and feet. ?A foot exam to inspect the structure and skin of your feet, including checking for cuts, bruises, redness, blisters, sores, or other problems. ?Screening for blood vessel (vascular) problems. This may include checking the pulse in your legs and feet and checking your temperature. ?Blood tests ?Depending on your treatment plan and your personal needs, you may have the following tests: ?Hemoglobin A1C (HbA1C). This test provides information about blood sugar (glucose) control over the previous 2-3 months. It is used to adjust your treatment plan, if needed. This test will be done: ?At least 2 times a year, if you are meeting your treatment goals. ?4 times a year, if you are not meeting your treatment goals or if your goals have changed. ?Lipid testing, including total cholesterol, LDL and HDL cholesterol, and triglyceride levels. ?The goal for LDL is less than 100 mg/dL (5.5 mmol/L). If you are at high risk for complications, the goal is less than 70 mg/dL (3.9 mmol/L). ?The goal for HDL is 40 mg/dL (2.2 mmol/L) or higher for men, and 50 mg/dL (2.8 mmol/L) or higher for  women. An HDL cholesterol of 60 mg/dL (3.3 mmol/L) or higher gives some protection against heart disease. ?The goal for triglycerides is less than 150 mg/dL (8.3 mmol/L). ?Liver function tests. ?Kidney function tests. ?Thyroid function tests. ? ?Dental and eye exams ? ?Visit your dentist two times a year. ?If you have type 1 diabetes, your health care provider may recommend an  eye exam within 5 years after you are diagnosed, and then once a year after your first exam. ?For children with type 1 diabetes, the health care provider may recommend an eye exam when your child is age 81 or older and has had diabetes for 3-5 years. After the first exam, your child should get an eye exam once a year. ?If you have type 2 diabetes, your health care provider may recommend an eye exam as soon as you are diagnosed, and then every 1-2 years after your first exam. ?Immunizations ?A yearly flu (influenza) vaccine is recommended annually for everyone 6 months or older. This is especially important if you have diabetes. ?The pneumonia (pneumococcal) vaccine is recommended for everyone 2 years or older who has diabetes. If you are age 62 or older, you may get the pneumonia vaccine as a series of two separate shots. ?The hepatitis B vaccine is recommended for adults shortly after being diagnosed with diabetes. ?Adults and children with diabetes should receive all other vaccines according to age-specific recommendations from the Centers for Disease Control and Prevention (CDC). ?Mental and emotional health ?Screening for symptoms of eating disorders, anxiety, and depression is recommended at the time of diagnosis and after as needed. If your screening shows that you have symptoms, you may need more evaluation. You may work with a mental health care provider. ?Follow these instructions at home: ?Treatment plan ?You will monitor your blood glucose levels and may give yourself insulin. Your treatment plan will be reviewed at every medical visit. You and your health care provider will discuss: ?How you are taking your medicines, including insulin. ?Any side effects you have. ?Your blood glucose level target goals. ?How often you monitor your blood glucose level. ?Lifestyle habits, such as activity level and tobacco, alcohol, and substance use. ?Education ?Your health care provider will assess how well you are  monitoring your blood glucose levels and whether you are taking your insulin and medicines correctly. He or she may refer you to: ?A certified diabetes care and education specialist to manage your diabetes throughout your life, starting at diagnosis. ?A registered dietitian who can create and review your personal nutrition plan. ?An exercise specialist who can discuss your activity level and exercise plan. ?General instructions ?Take over-the-counter and prescription medicines only as told by your health care provider. ?Keep all follow-up visits. This is important. ?Where to find support ?There are many diabetes support networks, including: ?American Diabetes Association (ADA): diabetes.org ?Defeat Diabetes Foundation: defeatdiabetes.org ?Where to find more information ?American Diabetes Association (ADA): www.diabetes.org ?Association of Diabetes Care & Education Specialists (ADCES): diabeteseducator.org ?International Diabetes Federation (IDF): https://www.munoz-bell.org/ ?Summary ?Managing diabetes (diabetes mellitus) can be complicated. Your diabetes treatment may be managed by a team of health care providers. ?Your health care providers follow guidelines to help you get the best quality care. ?You should have physical exams, blood tests, blood pressure monitoring, immunizations, and screening tests regularly. Stay updated on how to manage your diabetes. ?Your health care providers may also give you more specific instructions based on your individual health. ?This information is not intended to replace advice given to you by  your health care provider. Make sure you discuss any questions you have with your health care provider. ?Document Revised: 10/21/2019 Document Reviewed: 10/21/2019 ?Elsevier Patient Education ? [place]. ? ?

## 2021-07-04 NOTE — Progress Notes (Signed)
? ? ?Subjective  ?CC:  ?Chief Complaint  ?Patient presents with  ? Hypertension  ? Diabetes  ?  Pt is fasting. She has no concerns today.  ? Hyperlipidemia  ?  Dyslipidemia   ? ? ?HPI: Joyce Hatfield is a 76 y.o. female who presents to the office today for follow up of diabetes and problems listed above in the chief complaint.  ?Diabetes follow up: Her diabetic control is reported as Unchanged. However, she is eating a small amount of ice cream nightly and had a bout with thrush and then covid. Denies blurred vision, polyuria or polydipsia.  She denies exertional CP or SOB or symptomatic hypoglycemia. She denies foot sores or paresthesias. She wants to avoid medications. Exercises regularly. Diet is otherwise good. She has lost weight.  ?HTN: taking medications.  ?HLD on crestor now daily. Tolerating fine.  ? ?Wt Readings from Last 3 Encounters:  ?07/04/21 164 lb 9.6 oz (74.7 kg)  ?03/26/21 175 lb 0.7 oz (79.4 kg)  ?01/03/21 175 lb (79.4 kg)  ? ? ?BP Readings from Last 3 Encounters:  ?07/04/21 (!) 143/84  ?01/03/21 132/82  ?09/27/20 126/74  ? ? ?Assessment  ?1. Uncontrolled type 2 diabetes mellitus with hypoglycemia without coma ([place])   ?2. Dyslipidemia associated with type 2 diabetes mellitus ([place])   ?3. Essential hypertension   ?4. Arterial ectasia (HCC) Chronic  ? ?  ?Plan  ?Diabetes is currently poorly controlled. Now clearly uncontrolled after honeymoon phase. Pt wants to use CGM and change diet to get under control. Will f/u here in 3 months. Declines meds. Check labs. And urine ?HLD: on crestor. Crestor could be contributing to hyperglycemia. Check ldl and lfts ?HTN: borderline control. Will recheck in 3 months; if remains elevated, will adjust up meds.  ?Arterial ectasia stable.  ? ?Follow up: 3 mo for diabetes recheck. Marland Kitchen ?Orders Placed This Encounter  ?Procedures  ? Comprehensive metabolic panel  ? Lipid panel  ? Microalbumin / creatinine urine ratio  ? POCT HgB A1C  ? ?No orders of the defined types  were placed in this encounter. ? ?  ? ?Immunization History  ?Administered Date(s) Administered  ? Fluad Quad(high Dose 65+) 03/01/2021  ? Influenza Inj Mdck Quad Pf 05/05/2018  ? Influenza, High Dose Seasonal PF 02/16/2016, 02/05/2017, 02/03/2019  ? Influenza-Unspecified 02/24/2020  ? PFIZER(Purple Top)SARS-COV-2 Vaccination 06/05/2019, 06/30/2019, 02/04/2020, 09/05/2020  ? Pension scheme manager 35yr & up 01/15/2021  ? Pneumococcal Conjugate-13 06/15/2013  ? Pneumococcal Polysaccharide-23 09/28/2015  ? Tdap 10/23/2006, 04/04/2017  ? Zoster Recombinat (Shingrix) 10/09/2016, 12/22/2016  ? Zoster, Live 04/30/2007, 03/05/2010  ? ? ?Diabetes Related Lab Review: ?Lab Results  ?Component Value Date  ? HGBA1C 10.8 (A) 07/04/2021  ? HGBA1C 6.9 (A) 01/03/2021  ? HGBA1C 6.5 (A) 09/27/2020  ?  ?Lab Results  ?Component Value Date  ? MICROALBUR 1.0 06/26/2020  ? ?Lab Results  ?Component Value Date  ? CREATININE 0.77 01/03/2021  ? BUN 19 01/03/2021  ? NA 139 01/03/2021  ? K 4.1 01/03/2021  ? CL 102 01/03/2021  ? CO2 30 01/03/2021  ? ?Lab Results  ?Component Value Date  ? CHOL 215 (H) 01/03/2021  ? CHOL 227 (H) 06/14/2020  ? ?Lab Results  ?Component Value Date  ? HDL 63.30 01/03/2021  ? HDL 65.00 06/14/2020  ? ?Lab Results  ?Component Value Date  ? LDLCALC 131 (H) 01/03/2021  ? LDLCALC 125 (H) 06/14/2020  ? ?Lab Results  ?Component Value Date  ? TRIG 107.0  01/03/2021  ? TRIG 182.0 (H) 06/14/2020  ? ?Lab Results  ?Component Value Date  ? CHOLHDL 3 01/03/2021  ? CHOLHDL 3 06/14/2020  ? ?No results found for: LDLDIRECT ?The 10-year ASCVD risk score (Arnett DK, et al., 2019) is: 36.8% ?  Values used to calculate the score: ?    Age: 55 years ?    Sex: Female ?    Is Non-Hispanic African American: No ?    Diabetic: Yes ?    Tobacco smoker: No ?    Systolic Blood Pressure: 944 mmHg ?    Is BP treated: Yes ?    HDL Cholesterol: 63.3 mg/dL ?    Total Cholesterol: 215 mg/dL ?I have reviewed the [place], Fam and Soc  history. ?Patient Active Problem List  ? Diagnosis Date Noted  ? New onset type 2 diabetes mellitus ([place]) 06/26/2020  ?  Priority: High  ? Dyslipidemia associated with type 2 diabetes mellitus ([place]) 06/26/2020  ?  Priority: High  ? Hx of melanoma excision 11/13/2018  ?  Priority: High  ? OSA on CPAP 09/08/2014  ?  Priority: High  ? Essential hypertension 03/16/2014  ?  Priority: High  ? Serrated adenoma of colon 11/08/2013  ?  Priority: High  ?  2015 sessile serrated adenoma size ? And tubular adenoma recheck 5 yr ?  ? Restless legs 11/12/2018  ?  Priority: Medium   ? 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  Priority: Medium   ?  Dexa has been sl osteopenic. Was treated with fosamax x 5 years, then drug holiday, then restarted 2020. Plan to continue to 2025, then stop.  ?10/2020: T = -2.4 femur. Osteopenia.  ?09/25/2016 FRAX 19/3.4 ; low impact distal radius fracture. ?07/27/2013 DXA FRAX 15 / 1.7 ? ? ?  ? Chronic allergic rhinitis 11/12/2018  ?  Priority: Low  ? 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  Priority: Low  ?  Mild iliac artery ectasia noted on ultrasound abdominal aorta to rule out aneurysm, 2017.  See records from [place] ?  ? COVID 03/26/2021  ? 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  Suspect: elevated total bili; no sxs ?  ? ? ?Social History: ?Patient  reports that she has never smoked. She has never used smokeless tobacco. She reports that she does not drink alcohol and does not use drugs. ? ?Review of Systems: ?Ophthalmic: negative for eye pain, loss of vision or double vision ?Cardiovascular: negative for chest pain ?Respiratory: negative for SOB or persistent cough ?Gastrointestinal: negative for abdominal pain ?Genitourinary: negative for dysuria or gross hematuria ?MSK: negative for foot lesions ?Neurologic: negative for weakness or gait disturbance ? ?Objective  ?Vitals: BP (!) 143/84   Pulse 74   Temp 97.6 ?F (36.4 ?C) (Temporal)   Ht '5\' 9"'$  (1.753 m)   Wt 164 lb 9.6 oz (74.7 kg)   SpO2  97%   BMI 24.31 kg/m?  ?General: well appearing, no acute distress  ?Psych:  Alert and oriented, normal mood and affect ?HEENT:  Normocephalic, atraumatic, moist mucous membranes, supple neck  ? ? ? ? ?Diabetic education: ongoing education regarding chronic disease management for diabetes was given today. We continue to reinforce the ABC's of diabetic management: A1c (<7 or 8 dependent upon patient), tight blood pressure control, and cholesterol management with goal LDL < 100 minimally. We discuss diet strategies, exercise recommendations, medication options and possible side effects. At each visit, we review recommended immunizations and preventive care recommendations for diabetics  and stress that good diabetic control can prevent other problems. See below for this patient's data. ? ? ?Commons side effects, risks, benefits, and alternatives for medications and treatment plan prescribed today were discussed, and the patient expressed understanding of the given instructions. Patient is instructed to call or message via MyChart if he/she has any questions or concerns regarding our treatment plan. No barriers to understanding were identified. We discussed Red Flag symptoms and signs in detail. Patient expressed understanding regarding what to do in case of urgent or emergency type symptoms.  ?Medication list was reconciled, printed and provided to the patient in AVS. Patient instructions and summary information was reviewed with the patient as documented in the AVS. ?This note was prepared with assistance of Systems analyst. Occasional wrong-word or sound-a-like substitutions may have occurred due to the inherent limitations of voice recognition software ? ?This visit occurred during the SARS-CoV-2 public health emergency.  Safety protocols were in place, including screening questions prior to the visit, additional usage of staff PPE, and extensive cleaning of exam room while observing appropriate  contact time as indicated for disinfecting solutions.  ? ?

## 2021-07-11 ENCOUNTER — Other Ambulatory Visit: Payer: Self-pay

## 2021-07-11 ENCOUNTER — Telehealth: Payer: Self-pay | Admitting: Family Medicine

## 2021-07-11 DIAGNOSIS — R17 Unspecified jaundice: Secondary | ICD-10-CM

## 2021-07-11 NOTE — Telephone Encounter (Signed)
Please see lab note.

## 2021-07-11 NOTE — Telephone Encounter (Signed)
Pt returned a call to our office regarding lab results. Would like a call back ?

## 2021-07-13 ENCOUNTER — Other Ambulatory Visit: Payer: Self-pay

## 2021-07-13 ENCOUNTER — Ambulatory Visit (INDEPENDENT_AMBULATORY_CARE_PROVIDER_SITE_OTHER): Payer: Medicare HMO

## 2021-07-13 DIAGNOSIS — Z0000 Encounter for general adult medical examination without abnormal findings: Secondary | ICD-10-CM | POA: Diagnosis not present

## 2021-07-13 NOTE — Progress Notes (Signed)
? ?Subjective:  ? Joyce Hatfield is a 76 y.o. female who presents for an Initial Medicare Annual Wellness Visit. ? ?Review of Systems    ? ?Cardiac Risk Factors include: advanced age (>58mn, >>82women);hypertension;dyslipidemia;diabetes mellitus ? ?   ?Objective:  ?  ?There were no vitals filed for this visit. ?There is no height or weight on file to calculate BMI. ? ?Advanced Directives 07/13/2021 07/19/2020  ?Does Patient Have a Medical Advance Directive? No No  ?Would patient like information on creating a medical advance directive? Yes (MAU/Ambulatory/Procedural Areas - Information given) No - Patient declined  ? ? ?Current Medications (verified) ?Outpatient Encounter Medications as of 07/13/2021  ?Medication Sig  ? alendronate (FOSAMAX) 70 MG tablet TAKE 1 TABLET BY MOUTH EVERY 7 DAYS. TAKE WITH A FULL GLASS OF WATER DO NOT LIE DOWN FOR THE NEXT 30 MINS  ? Calcium Carbonate-Vitamin D 600-400 MG-UNIT tablet Take 1 tablet by mouth daily.  ? Cholecalciferol (VITAMIN D) 1000 UNITS capsule Take 1,000 Units by mouth daily.  ? Continuous Blood Gluc Receiver (FREESTYLE LIBRE 14 DAY READER) DEVI Use as directed  ? Continuous Blood Gluc Sensor (FREESTYLE LIBRE 3 SENSOR) MISC Apply 1 each topically every 14 (fourteen) days.  ? ferrous sulfate 325 (65 FE) MG tablet Take by mouth.  ? fexofenadine (ALLEGRA) 180 MG tablet Take by mouth.  ? hydrochlorothiazide (HYDRODIURIL) 25 MG tablet TAKE 1 TABLET (25 MG TOTAL) BY MOUTH DAILY.  ? lisinopril (ZESTRIL) 10 MG tablet TAKE 1 TABLET BY MOUTH EVERY DAY  ? Multiple Vitamin (MULTIVITAMIN) capsule Take 1 capsule by mouth daily.  ? rosuvastatin (CRESTOR) 5 MG tablet Take 1 tablet (5 mg total) by mouth at bedtime.  ? vitamin C (ASCORBIC ACID) 500 MG tablet Take 500 mg by mouth daily.  ? FLUZONE HIGH-DOSE QUADRIVALENT 0.7 ML SUSY   ? PFIZER COVID-19 VAC BIVALENT injection   ? ?No facility-administered encounter medications on file as of 07/13/2021.  ? ? ?Allergies (verified) ?Bee venom   ? ?History: ?Past Medical History:  ?Diagnosis Date  ? Cancer ([facility] 2008  ? melanoma  ? Diabetes mellitus without complication ([place]   ? Hyperlipidemia   ? Hypertension   ? Osteoporosis   ? Sleep apnea   ? using CPAP since 2016  ? ?Past Surgical History:  ?Procedure Laterality Date  ? [place] ? BREAST BIOPSY Right 2010  ? benign  ? [place] ? ENDOMETRIOUSIS LAPAROSCOPY  1985  ? MELANOMA REMOVED  2008  ? ?Family History  ?Problem Relation Age of Onset  ? Cancer Mother   ? Hypertension Mother   ? Heart disease Father   ? Hypertension Father   ? Diabetes Brother   ? Breast cancer Paternal Aunt   ? Early death Paternal Grandmother   ? Heart disease Paternal Grandfather   ? ?Social History  ? ?Socioeconomic History  ? Marital status: Married  ?  Spouse name: Not on file  ? Number of children: 0  ? Years of education: Not on file  ? Highest education level: Not on file  ?Occupational History  ? Not on file  ?Tobacco Use  ? Smoking status: Never  ? Smokeless tobacco: Never  ?Vaping Use  ? Vaping Use: Never used  ?Substance and Sexual Activity  ? Alcohol use: No  ?  Comment: OCCASIONALLY  ? Drug use: Never  ? Sexual activity: Not on file  ?Other Topics Concern  ? Not on file  ?  Social History Narrative  ? Not on file  ? ?Social Determinants of Health  ? ?Financial Resource Strain: Low Risk   ? Difficulty of Paying Living Expenses: Not hard at all  ?Food Insecurity: No Food Insecurity  ? Worried About Charity fundraiser in the Last Year: Never true  ? Ran Out of Food in the Last Year: Never true  ?Transportation Needs: No Transportation Needs  ? Lack of Transportation (Medical): No  ? Lack of Transportation (Non-Medical): No  ?Physical Activity: Sufficiently Active  ? Days of Exercise per Week: 6 days  ? Minutes of Exercise per Session: 60 min  ?Stress: No Stress Concern Present  ? Feeling of Stress : Not at all  ?Social Connections: Moderately Integrated  ? Frequency of Communication with Friends  and Family: Once a week  ? Frequency of Social Gatherings with Friends and Family: More than three times a week  ? Attends Religious Services: Never  ? Active Member of Clubs or Organizations: Yes  ? Attends Archivist Meetings: 1 to 4 times per year  ? Marital Status: Married  ? ? ?Tobacco Counseling ?Counseling given: Not Answered ? ? ?Clinical Intake: ? ?Pre-visit preparation completed: Yes ? ?Pain : No/denies pain ? ?  ? ?BMI - recorded: 24.31 ?Nutritional Status: BMI of 19-24  Normal ?Diabetes: Yes ?CBG done?: Yes (265 cgm) ?CBG resulted in Enter/ Edit results?: No ?Did pt. bring in CBG monitor from home?: No ? ?How often do you need to have someone help you when you read instructions, pamphlets, or other written materials from your doctor or pharmacy?: 1 - Never ? ?Diabetic?Nutrition Risk Assessment: ? ?Has the patient had any N/V/D within the last 2 months?  No  ?Does the patient have any non-healing wounds?  No  ?Has the patient had any unintentional weight loss or weight gain?  No  ? ?Diabetes: ? ?Is the patient diabetic?  Yes  ?If diabetic, was a CBG obtained today?  Yes  ?Did the patient bring in their glucometer from home?  No  ?How often do you monitor your CBG's? CGM monitoring .  ? ?Financial Strains and Diabetes Management: ? ?Are you having any financial strains with the device, your supplies or your medication? No .  ?Does the patient want to be seen by Chronic Care Management for management of their diabetes?  No  ?Would the patient like to be referred to a Nutritionist or for Diabetic Management?  No  ? ?Diabetic Exams: ? ?Diabetic Eye Exam: Completed 03/05/21 ?Diabetic Foot Exam: Completed 09/27/20 ? ? ?Interpreter Needed?: No ? ?Information entered by :: Charlott Rakes, LPN ? ? ?Activities of Daily Living ?In your present state of health, do you have any difficulty performing the following activities: 07/13/2021  ?Hearing? N  ?Vision? N  ?Difficulty concentrating or making decisions? N   ?Walking or climbing stairs? N  ?Dressing or bathing? N  ?Doing errands, shopping? N  ?Preparing Food and eating ? N  ?Using the Toilet? N  ?In the past six months, have you accidently leaked urine? N  ?Do you have problems with loss of bowel control? N  ?Managing your Medications? N  ?Managing your Finances? N  ?Housekeeping or managing your Housekeeping? N  ?Some recent data might be hidden  ? ? ?Patient Care Team: ?Leamon Arnt, MD as PCP - General (Family Medicine) ? ?Indicate any recent Medical Services you may have received from other than [facility] providers in the past year (date may  be approximate). ? ?   ?Assessment:  ? This is a routine wellness examination for [facility]. ? ?Hearing/Vision screen ?Hearing Screening - Comments:: Pt denies any hearing issues  ?Vision Screening - Comments:: Pt follows up with [facility] opthalmology Dr Melissa Noon for annual eye exams  ? ?Dietary issues and exercise activities discussed: ?Current Exercise Habits: Home exercise routine, Type of exercise: walking;Other - see comments, Time (Minutes): 60, Frequency (Times/Week): 7, Weekly Exercise (Minutes/Week): 420 ? ? Goals Addressed   ? ?  ?  ?  ?  ? This Visit's Progress  ?  Patient Stated     ?  Get blood Sugar under control  ?  ? ?  ? ?Depression Screen ?PHQ 2/9 Scores 07/13/2021 01/03/2021 07/19/2020 06/14/2020  ?PHQ - 2 Score 0 0 0 0  ?  ?Fall Risk ?Fall Risk  07/13/2021 01/03/2021 07/19/2020  ?Falls in the past year? 0 1 0  ?Number falls in past yr: 0 1 -  ?Injury with Fall? 0 0 -  ?Risk for fall due to : Impaired vision - -  ?Follow up Falls prevention discussed - -  ? ? ?FALL RISK PREVENTION PERTAINING TO THE HOME: ? ?Any stairs in or around the home? Yes  ?If so, are there any without handrails? No  ?Home free of loose throw rugs in walkways, pet beds, electrical cords, etc? Yes  ?Adequate lighting in your home to reduce risk of falls? Yes  ? ?ASSISTIVE DEVICES UTILIZED TO PREVENT FALLS: ? ?Life alert? No  ?Use of a cane,  walker or w/c? No  ?Grab bars in the bathroom? No  ?Shower chair or bench in shower? No  ?Elevated toilet seat or a handicapped toilet? No  ? ?TIMED UP AND GO: ? ?Was the test performed? No .  ?Cognitive

## 2021-07-13 NOTE — Patient Instructions (Signed)
Ms. Denman , ?Thank you for taking time to come for your Medicare Wellness Visit. I appreciate your ongoing commitment to your health goals. Please review the following plan we discussed and let me know if I can assist you in the future.  ? ?Screening recommendations/referrals: ?Colonoscopy: Done 01/29/19 repeat every 5 years  ?Mammogram: Done  ?Bone Density: Done 11/09/20 repeat every 3 years  ?Recommended yearly ophthalmology/optometry visit for glaucoma screening and checkup ?Recommended yearly dental visit for hygiene and checkup ? ?Vaccinations: ?Influenza vaccine: Done 03/01/21 repeat every  year  ?Pneumococcal vaccine: Up to date ?Tdap vaccine: Completed 04/04/17 repeat every 10 years  ?Shingles vaccine: Completed 6/3, 12/22/16    ?Covid-19:Completed 2/6, 3/3, 02/04/20 * 5/10, 01/15/21 ? ?Advanced directives: Advance directive discussed with you today. I have provided a copy for you to complete at home and have notarized. Once this is complete please bring a copy in to our office so we can scan it into your chart. ? ?Conditions/risks identified: Get control of blood sugar  ? ?Next appointment: Follow up in one year for your annual wellness visit  ? ? ?Preventive Care 13 Years and Older, Female ?Preventive care refers to lifestyle choices and visits with your health care provider that can promote health and wellness. ?What does preventive care include? ?A yearly physical exam. This is also called an annual well check. ?Dental exams once or twice a year. ?Routine eye exams. Ask your health care provider how often you should have your eyes checked. ?Personal lifestyle choices, including: ?Daily care of your teeth and gums. ?Regular physical activity. ?Eating a healthy diet. ?Avoiding tobacco and drug use. ?Limiting alcohol use. ?Practicing safe sex. ?Taking low-dose aspirin every day. ?Taking vitamin and mineral supplements as recommended by your health care provider. ?What happens during an annual well check? ?The  services and screenings done by your health care provider during your annual well check will depend on your age, overall health, lifestyle risk factors, and family history of disease. ?Counseling  ?Your health care provider may ask you questions about your: ?Alcohol use. ?Tobacco use. ?Drug use. ?Emotional well-being. ?Home and relationship well-being. ?Sexual activity. ?Eating habits. ?History of falls. ?Memory and ability to understand (cognition). ?Work and work Statistician. ?Reproductive health. ?Screening  ?You may have the following tests or measurements: ?Height, weight, and BMI. ?Blood pressure. ?Lipid and cholesterol levels. These may be checked every 5 years, or more frequently if you are over 40 years old. ?Skin check. ?Lung cancer screening. You may have this screening every year starting at age 30 if you have a 30-pack-year history of smoking and currently smoke or have quit within the past 15 years. ?Fecal occult blood test (FOBT) of the stool. You may have this test every year starting at age 74. ?Flexible sigmoidoscopy or colonoscopy. You may have a sigmoidoscopy every 5 years or a colonoscopy every 10 years starting at age 65. ?Hepatitis C blood test. ?Hepatitis B blood test. ?Sexually transmitted disease (STD) testing. ?Diabetes screening. This is done by checking your blood sugar (glucose) after you have not eaten for a while (fasting). You may have this done every 1-3 years. ?Bone density scan. This is done to screen for osteoporosis. You may have this done starting at age 49. ?Mammogram. This may be done every 1-2 years. Talk to your health care provider about how often you should have regular mammograms. ?Talk with your health care provider about your test results, treatment options, and if necessary, the need for more tests. ?  Vaccines  ?Your health care provider may recommend certain vaccines, such as: ?Influenza vaccine. This is recommended every year. ?Tetanus, diphtheria, and acellular  pertussis (Tdap, Td) vaccine. You may need a Td booster every 10 years. ?Zoster vaccine. You may need this after age 26. ?Pneumococcal 13-valent conjugate (PCV13) vaccine. One dose is recommended after age 45. ?Pneumococcal polysaccharide (PPSV23) vaccine. One dose is recommended after age 88. ?Talk to your health care provider about which screenings and vaccines you need and how often you need them. ?This information is not intended to replace advice given to you by your health care provider. Make sure you discuss any questions you have with your health care provider. ?Document Released: 05/12/2015 Document Revised: 01/03/2016 Document Reviewed: 02/14/2015 ?Elsevier Interactive Patient Education ? 2017 [place]. ? ?Fall Prevention in the Home ?Falls can cause injuries. They can happen to people of all ages. There are many things you can do to make your home safe and to help prevent falls. ?What can I do on the outside of my home? ?Regularly fix the edges of walkways and driveways and fix any cracks. ?Remove anything that might make you trip as you walk through a door, such as a raised step or threshold. ?Trim any bushes or trees on the path to your home. ?Use bright outdoor lighting. ?Clear any walking paths of anything that might make someone trip, such as rocks or tools. ?Regularly check to see if handrails are loose or broken. Make sure that both sides of any steps have handrails. ?Any raised decks and porches should have guardrails on the edges. ?Have any leaves, snow, or ice cleared regularly. ?Use sand or salt on walking paths during winter. ?Clean up any spills in your garage right away. This includes oil or grease spills. ?What can I do in the bathroom? ?Use night lights. ?Install grab bars by the toilet and in the tub and shower. Do not use towel bars as grab bars. ?Use non-skid mats or decals in the tub or shower. ?If you need to sit down in the shower, use a plastic, non-slip stool. ?Keep the floor  dry. Clean up any water that spills on the floor as soon as it happens. ?Remove soap buildup in the tub or shower regularly. ?Attach bath mats securely with double-sided non-slip rug tape. ?Do not have throw rugs and other things on the floor that can make you trip. ?What can I do in the bedroom? ?Use night lights. ?Make sure that you have a light by your bed that is easy to reach. ?Do not use any sheets or blankets that are too big for your bed. They should not hang down onto the floor. ?Have a firm chair that has side arms. You can use this for support while you get dressed. ?Do not have throw rugs and other things on the floor that can make you trip. ?What can I do in the kitchen? ?Clean up any spills right away. ?Avoid walking on wet floors. ?Keep items that you use a lot in easy-to-reach places. ?If you need to reach something above you, use a strong step stool that has a grab bar. ?Keep electrical cords out of the way. ?Do not use floor polish or wax that makes floors slippery. If you must use wax, use non-skid floor wax. ?Do not have throw rugs and other things on the floor that can make you trip. ?What can I do with my stairs? ?Do not leave any items on the stairs. ?Make sure that  there are handrails on both sides of the stairs and use them. Fix handrails that are broken or loose. Make sure that handrails are as long as the stairways. ?Check any carpeting to make sure that it is firmly attached to the stairs. Fix any carpet that is loose or worn. ?Avoid having throw rugs at the top or bottom of the stairs. If you do have throw rugs, attach them to the floor with carpet tape. ?Make sure that you have a light switch at the top of the stairs and the bottom of the stairs. If you do not have them, ask someone to add them for you. ?What else can I do to help prevent falls? ?Wear shoes that: ?Do not have high heels. ?Have rubber bottoms. ?Are comfortable and fit you well. ?Are closed at the toe. Do not wear  sandals. ?If you use a stepladder: ?Make sure that it is fully opened. Do not climb a closed stepladder. ?Make sure that both sides of the stepladder are locked into place. ?Ask someone to hold it for you, if possibl

## 2021-07-13 NOTE — Progress Notes (Signed)
?My Note Attested    ?8:50 AM  ? ?Delete ?Copy ?  ?  ? Attestation signed by Leamon Arnt, MD at 07/13/2021  9:48 AM ?  ?I have reviewed the documented AWV and agree with the findings as documented by our nurse educator.  I was available for consultation during the visit. I will follow up with patient on any outstanding screens that are needed, results or new concerns that were found on this documented screening visit.  ?   ?  ?  ?  ?Expand All Collapse All ?Virtual Visit via Telephone Note ? ?I connected with  [name] on 07/13/21 at  8:45 AM EDT by telephone and verified that I am speaking with the correct person using two identifiers. ? ?Medicare Annual Wellness visit completed telephonically due to Covid-19 pandemic.  ? ?Persons participating in this call: This Health Coach and this patient.  ? ?Location: ?Patient: Home  ?Provider: Office  ?  ?I discussed the limitations, risks, security and privacy concerns of performing an evaluation and management service by telephone and the availability of in person appointments. The patient expressed understanding and agreed to proceed. ? ?Unable to perform video visit due to video visit attempted and failed and/or patient does not have video capability.  ? ?Some vital signs may be absent or patient reported.  ? ?Willette Brace, LPN ? ?  ?Subjective:  ?  ?Subjective  ? Joyce Hatfield is a 76 y.o. female who presents for an Initial Medicare Annual Wellness Visit. ?  ?Review of Systems    ?  ?Cardiac Risk Factors include: advanced age (>23mn, >>34women);hypertension;dyslipidemia;diabetes mellitus ?  ?  ?  ?Objective:  ?  ?Objective  ?  ?There were no vitals filed for this visit. ?There is no height or weight on file to calculate BMI. ?  ?Advanced Directives 07/13/2021 07/19/2020  ?Does Patient Have a Medical Advance Directive? No No  ?Would patient like information on creating a medical advance directive? Yes (MAU/Ambulatory/Procedural Areas - Information  given) No - Patient declined  ?  ?  ?Current Medications (verified) ?    ?Outpatient Encounter Medications as of 07/13/2021  ?Medication Sig  ? alendronate (FOSAMAX) 70 MG tablet TAKE 1 TABLET BY MOUTH EVERY 7 DAYS. TAKE WITH A FULL GLASS OF WATER DO NOT LIE DOWN FOR THE NEXT 30 MINS  ? Calcium Carbonate-Vitamin D 600-400 MG-UNIT tablet Take 1 tablet by mouth daily.  ? Cholecalciferol (VITAMIN D) 1000 UNITS capsule Take 1,000 Units by mouth daily.  ? Continuous Blood Gluc Receiver (FREESTYLE LIBRE 14 DAY READER) DEVI Use as directed  ? Continuous Blood Gluc Sensor (FREESTYLE LIBRE 3 SENSOR) MISC Apply 1 each topically every 14 (fourteen) days.  ? ferrous sulfate 325 (65 FE) MG tablet Take by mouth.  ? fexofenadine (ALLEGRA) 180 MG tablet Take by mouth.  ? hydrochlorothiazide (HYDRODIURIL) 25 MG tablet TAKE 1 TABLET (25 MG TOTAL) BY MOUTH DAILY.  ? lisinopril (ZESTRIL) 10 MG tablet TAKE 1 TABLET BY MOUTH EVERY DAY  ? Multiple Vitamin (MULTIVITAMIN) capsule Take 1 capsule by mouth daily.  ? rosuvastatin (CRESTOR) 5 MG tablet Take 1 tablet (5 mg total) by mouth at bedtime.  ? vitamin C (ASCORBIC ACID) 500 MG tablet Take 500 mg by mouth daily.  ? FLUZONE HIGH-DOSE QUADRIVALENT 0.7 ML SUSY    ? PFIZER COVID-19 VAC BIVALENT injection    ?  ?No facility-administered encounter medications on file as of 07/13/2021.  ?  ?  ?  Allergies (verified) ?Bee venom  ?  ?History: ?    ?Past Medical History:  ?Diagnosis Date  ? Cancer [facility]) 2008  ?  melanoma  ? Diabetes mellitus without complication ([place])    ? Hyperlipidemia    ? Hypertension    ? Osteoporosis    ? Sleep apnea    ?  using CPAP since 2016  ?  ?     ?Past Surgical History:  ?Procedure Laterality Date  ? [place]  ? BREAST BIOPSY Right 2010  ?  benign  ? [place]  ? ENDOMETRIOUSIS LAPAROSCOPY   1985  ? MELANOMA REMOVED   2008  ?  ?     ?Family History  ?Problem Relation Age of Onset  ? Cancer Mother    ? Hypertension Mother    ? Heart disease Father     ? Hypertension Father    ? Diabetes Brother    ? Breast cancer Paternal Aunt    ? Early death Paternal Grandmother    ? Heart disease Paternal Grandfather    ?  ?Social History  ?  ?     ?Socioeconomic History  ? Marital status: Married  ?    Spouse name: Not on file  ? Number of children: 0  ? Years of education: Not on file  ? Highest education level: Not on file  ?Occupational History  ? Not on file  ?Tobacco Use  ? Smoking status: Never  ? Smokeless tobacco: Never  ?Vaping Use  ? Vaping Use: Never used  ?Substance and Sexual Activity  ? Alcohol use: No  ?    Comment: OCCASIONALLY  ? Drug use: Never  ? Sexual activity: Not on file  ?Other Topics Concern  ? Not on file  ?Social History Narrative  ? Not on file  ?  ?Social Determinants of Health  ?  ?   ?Financial Resource Strain: Low Risk   ? Difficulty of Paying Living Expenses: Not hard at all  ?Food Insecurity: No Food Insecurity  ? Worried About Charity fundraiser in the Last Year: Never true  ? Ran Out of Food in the Last Year: Never true  ?Transportation Needs: No Transportation Needs  ? Lack of Transportation (Medical): No  ? Lack of Transportation (Non-Medical): No  ?Physical Activity: Sufficiently Active  ? Days of Exercise per Week: 6 days  ? Minutes of Exercise per Session: 60 min  ?Stress: No Stress Concern Present  ? Feeling of Stress : Not at all  ?Social Connections: Moderately Integrated  ? Frequency of Communication with Friends and Family: Once a week  ? Frequency of Social Gatherings with Friends and Family: More than three times a week  ? Attends Religious Services: Never  ? Active Member of Clubs or Organizations: Yes  ? Attends Archivist Meetings: 1 to 4 times per year  ? Marital Status: Married  ?  ?  ?Tobacco Counseling ?Counseling given: Not Answered ?  ?  ?Clinical Intake: ?  ?Pre-visit preparation completed: Yes ?  ?Pain : No/denies pain ?  ?  ?BMI - recorded: 24.31 ?Nutritional Status: BMI of 19-24  Normal ?Diabetes:  Yes ?CBG done?: Yes (265 cgm) ?CBG resulted in Enter/ Edit results?: No ?Did pt. bring in CBG monitor from home?: No ?  ?How often do you need to have someone help you when you read instructions, pamphlets, or other written materials from your doctor or pharmacy?: 1 -  Never ?  ?Diabetic?Nutrition Risk Assessment: ?  ?Has the patient had any N/V/D within the last 2 months?  No  ?Does the patient have any non-healing wounds?  No  ?Has the patient had any unintentional weight loss or weight gain?  No  ?  ?Diabetes: ?  ?Is the patient diabetic?  Yes  ?If diabetic, was a CBG obtained today?  Yes  ?Did the patient bring in their glucometer from home?  No  ?How often do you monitor your CBG's? CGM monitoring .  ?  ?Financial Strains and Diabetes Management: ?  ?Are you having any financial strains with the device, your supplies or your medication? No .  ?Does the patient want to be seen by Chronic Care Management for management of their diabetes?  No  ?Would the patient like to be referred to a Nutritionist or for Diabetic Management?  No  ?  ?Diabetic Exams: ?  ?Diabetic Eye Exam: Completed 03/05/21 ?Diabetic Foot Exam: Completed 09/27/20 ?  ?  ?Interpreter Needed?: No ?  ?Information entered by :: Charlott Rakes, LPN ?  ?  ?Activities of Daily Living ?In your present state of health, do you have any difficulty performing the following activities: 07/13/2021  ?Hearing? N  ?Vision? N  ?Difficulty concentrating or making decisions? N  ?Walking or climbing stairs? N  ?Dressing or bathing? N  ?Doing errands, shopping? N  ?Preparing Food and eating ? N  ?Using the Toilet? N  ?In the past six months, have you accidently leaked urine? N  ?Do you have problems with loss of bowel control? N  ?Managing your Medications? N  ?Managing your Finances? N  ?Housekeeping or managing your Housekeeping? N  ?Some recent data might be hidden  ?  ?  ?Patient Care Team: ?Leamon Arnt, MD as PCP - General (Family Medicine) ?  ?Indicate any recent  Medical Services you may have received from other than [facility] providers in the past year (date may be approximate). ?  ?  ?  ?Assessment:  ?  ?Assessment ? This is a routine wellness examination for [facility].

## 2021-07-15 ENCOUNTER — Encounter: Payer: Self-pay | Admitting: Family Medicine

## 2021-07-18 ENCOUNTER — Other Ambulatory Visit: Payer: Self-pay | Admitting: Family Medicine

## 2021-07-23 ENCOUNTER — Encounter: Payer: Self-pay | Admitting: Family Medicine

## 2021-08-03 ENCOUNTER — Ambulatory Visit
Admission: RE | Admit: 2021-08-03 | Discharge: 2021-08-03 | Disposition: A | Discharge status: Home / Self Care | Payer: Medicare HMO | Source: Ambulatory Visit | Attending: Family Medicine | Admitting: Family Medicine

## 2021-08-03 DIAGNOSIS — R17 Unspecified jaundice: Secondary | ICD-10-CM

## 2021-08-06 ENCOUNTER — Telehealth: Payer: Self-pay | Admitting: Family Medicine

## 2021-08-06 NOTE — Telephone Encounter (Signed)
Ray from [facility] called regarding this pt. Please call [phone] ?

## 2021-08-06 NOTE — Telephone Encounter (Signed)
Spoke with [facility]  ?Want to make shore we received results [place] results  ?

## 2021-08-07 ENCOUNTER — Other Ambulatory Visit: Payer: Self-pay

## 2021-08-08 ENCOUNTER — Other Ambulatory Visit: Payer: Self-pay

## 2021-08-08 DIAGNOSIS — N2889 Other specified disorders of kidney and ureter: Secondary | ICD-10-CM

## 2021-08-08 DIAGNOSIS — R160 Hepatomegaly, not elsewhere classified: Secondary | ICD-10-CM

## 2021-08-14 ENCOUNTER — Encounter: Payer: Self-pay | Admitting: Family Medicine

## 2021-08-14 ENCOUNTER — Ambulatory Visit (INDEPENDENT_AMBULATORY_CARE_PROVIDER_SITE_OTHER): Payer: Medicare HMO | Admitting: Family Medicine

## 2021-08-14 VITALS — BP 120/80 | HR 75 | Temp 97.3°F | Ht 69.0 in | Wt 162.4 lb

## 2021-08-14 DIAGNOSIS — S30860A Insect bite (nonvenomous) of lower back and pelvis, initial encounter: Secondary | ICD-10-CM | POA: Diagnosis not present

## 2021-08-14 DIAGNOSIS — W57XXXA Bitten or stung by nonvenomous insect and other nonvenomous arthropods, initial encounter: Secondary | ICD-10-CM | POA: Diagnosis not present

## 2021-08-14 MED ORDER — DOXYCYCLINE HYCLATE 100 MG PO TABS: 100.0000 mg | ORAL_TABLET | Freq: Two times a day (BID) | ORAL | 0 refills | Status: DC

## 2021-08-14 NOTE — Patient Instructions (Signed)
Please follow up as scheduled for your next visit with me: 10/04/2021  ? ?If you have any questions or concerns, please don't hesitate to send me a message via MyChart or call the office at [phone]. Thank you for visiting with [place] today! It's our pleasure caring for you.  ? ?Tick Bite Information, Adult ?Ticks are insects that draw blood for food. Most ticks live in shrubs and grassy and wooded areas. They climb onto people and animals that brush against the leaves and grasses that they rest on. Then they bite, attaching themselves to the skin. Most ticks are harmless, but some ticks may carry germs that can spread to a person through a bite and cause a disease. To reduce your risk of getting a disease from a tick bite, make sure you: ?Take steps to prevent tick bites. ?Check for ticks after being outdoors where ticks live. ?Watch for symptoms of disease if a tick attached to you or if you suspect a tick bite. ?How can I prevent tick bites? ?Take these steps to help prevent tick bites when you go outdoors in an area where ticks live: ?Use insect repellent ?Use insect repellent that has DEET (20% or higher), picaridin, or IR3535 in it. Follow the instructions on the label. Use these products on: ?Bare skin. ?The top of your boots. ?Your pant legs. ?Your sleeve cuffs. ?For insect repellent that contains permethrin, follow the instructions on the label. Use these products on: ?Clothing. ?Boots. ?Outdoor gear. ?Tents. ?When you are outside ?Wear protective clothing. Long sleeves and long pants offer the best protection from ticks. ?Wear light-colored clothing so you can see ticks more easily. ?Tuck your pant legs into your socks. ?If you go walking on a trail, stay in the middle of the trail so your skin, hair, and clothing do not touch the bushes. ?Avoid walking through areas with long grass. ?Check for ticks on your clothing, hair, and skin often while you are outside, and check again before you go inside. Make sure  to check the scalp, neck, armpits, waist, groin, and joint areas. These are the spots where ticks attach themselves most often. ?When you go indoors ?Check your clothing for ticks. Tumble dry clothes in a dryer on high heat for at least 10 minutes. If clothes are damp, additional time may be needed. If clothes require washing, use hot water. ?Examine gear and pets. ?Shower soon after being outdoors. ?Check your body for ticks. Conduct a full body check using a mirror. ?What is the proper way to remove a tick? ?If you find a tick on your body, remove it as soon as possible. Removing a tick sooner can prevent germs from passing to your body. Do not remove the tick with your bare fingers. To remove a tick that is crawling on your skin but has not bitten, use either of these methods: ?Go outdoors and brush the tick off. ?Remove the tick with tape or a lint roller. ?To remove a tick that is attached to your skin: ?Wash your hands. If you have latex gloves, put them on. ?Use fine-tipped tweezers, curved forceps, or a tick-removal tool to gently grasp the tick as close to your skin and the tick's head as possible. ?Gently pull with a steady, upward, even pressure until the tick lets go. ?When removing the tick: ?Take care to keep the tick's head attached to its body. ?Do not twist or jerk the tick. This can make the tick's head or mouth parts break off and  remain in the skin. ?Do not squeeze or crush the tick's body. This could force disease-carrying fluids from the tick into your body. ?Do not try to remove a tick with heat, alcohol, petroleum jelly, or fingernail polish. Using these methods can cause the tick to salivate and regurgitate into your bloodstream, increasing your risk of getting a disease. ?What should I do after removing a tick? ?Dispose of the tick. Do not crush a tick with your fingers. ?Clean the bite area and your hands with soap and water, rubbing alcohol, or an iodine scrub. ?If an antiseptic cream or  ointment is available, apply a small amount to the bite site. ?Wash and disinfect any instruments that you used to remove the tick. ?How should I dispose of a tick? ?To dispose of a live tick, use one of these methods: ?Place it in rubbing alcohol. ?Place it in a sealed bag or container. ?Wrap it tightly in tape. ?Flush it down the toilet. ?Contact a health care provider if: ?You have symptoms of a disease after a tick bite. Symptoms of a tick-borne disease can occur from moments after the tick bites to 30 days after a tick is removed. Symptoms include: ?Fever or chills. ?Any of these signs in the bite area: ?A red rash that makes a circle (bull's-eye rash) in the bite area. ?Redness and swelling. ?Headache. ?Muscle, joint, or bone pain. ?Abnormal tiredness. ?Numbness in your legs or difficulty walking or moving your legs. ?Tender, swollen lymph glands. ?A part of a tick breaks off and gets stuck in your skin. ?Get help right away if: ?You are not able to remove a tick. ?You experience muscle weakness or paralysis. ?Your symptoms get worse or you experience new symptoms. ?You find an engorged tick on your skin and you are in an area where disease from ticks is a high risk. ?Summary ?Ticks may carry germs that can spread to a person through a bite and cause a disease. ?Wear protective clothing and use insect repellent to prevent tick bites. Follow the instructions on the label. ?If you find a tick on your body, remove it as soon as possible. If the tick is attached, do not try to remove with heat, alcohol, petroleum jelly, or fingernail polish. ?Remove the attached tick using fine-tipped tweezers, curved forceps, or a tick-removal tool. Gently pull with steady, upward, even pressure until the tick lets go. Do not twist or jerk the tick. Do not squeeze or crush the tick's body. ?If you have symptoms of a disease after being bitten by a tick, contact a health care provider. ?This information is not intended to replace  advice given to you by your health care provider. Make sure you discuss any questions you have with your health care provider. ?Document Revised: 04/12/2019 Document Reviewed: 04/12/2019 ?Elsevier Patient Education ? [place]. ? ?

## 2021-08-14 NOTE — Progress Notes (Signed)
? ?Subjective  ?CC:  ?Chief Complaint  ?Patient presents with  ? Tick Removal  ?  Pt stated that she removed a tick off her back Monday morning. It is red and raised with a hard spot in the middle.  ? ? ?HPI: Joyce Hatfield is a 76 y.o. female who presents to the office today to address the problems listed above in the chief complaint. ?76 year old with diabetes presents for tick bite.  She noted the tick bite probably 2 to 3 hours after it occurred.  She was only able to partially remove the tick.  She says the tick was not engorged.  She physically feels well.  No fevers or rash, however there is a large red area around the tick bite.  No itching or pain.  No drainage. ? ?Assessment  ?1. Tick bite of lower back, initial encounter   ?2. Tick bite with subsequent removal of tick   ? ?  ?Plan  ?Tick bite with reaction: Discussed indications for antibiotic prophylaxis for tickborne illness.  She does have a reaction at the site.  Likely more allergic than infective.  Patient does have history of Lyme disease.  She elects doxycycline 100 mg twice daily x7 days.  This is reasonable.  Tolerated the procedure well.  Routine wound care site reviewed. ? ?Follow up: As scheduled ?10/04/2021 ? ?No orders of the defined types were placed in this encounter. ? ?Meds ordered this encounter  ?Medications  ? doxycycline (VIBRA-TABS) 100 MG tablet  ?  Sig: Take 1 tablet (100 mg total) by mouth 2 (two) times daily for 7 days.  ?  Dispense:  14 tablet  ?  Refill:  0  ? ?  ? ?I reviewed the patients updated PMH, FH, and SocHx.  ?  ?Patient Active Problem List  ? Diagnosis Date Noted  ? New onset type 2 diabetes mellitus ([place]) 06/26/2020  ?  Priority: High  ? Dyslipidemia associated with type 2 diabetes mellitus ([place]) 06/26/2020  ?  Priority: High  ? Hx of melanoma excision 11/13/2018  ?  Priority: High  ? OSA on CPAP 09/08/2014  ?  Priority: High  ? Essential hypertension 03/16/2014  ?  Priority: High  ? Serrated adenoma of colon  11/08/2013  ?  Priority: High  ? Restless legs 11/12/2018  ?  Priority: Medium   ? 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  Priority: Medium   ? Chronic allergic rhinitis 11/12/2018  ?  Priority: Low  ? 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  Priority: Low  ? COVID 03/26/2021  ? 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 ?Current Meds  ?Medication Sig  ? Calcium Carbonate-Vitamin D 600-400 MG-UNIT tablet Take 1 tablet by mouth daily.  ? Cholecalciferol (VITAMIN D) 1000 UNITS capsule Take 1,000 Units by mouth daily.  ? Continuous Blood Gluc Receiver (FREESTYLE LIBRE 14 DAY READER) DEVI Use as directed  ? Continuous Blood Gluc Sensor (FREESTYLE LIBRE 3 SENSOR) MISC Apply 1 each topically every 14 (fourteen) days.  ? doxycycline (VIBRA-TABS) 100 MG tablet Take 1 tablet (100 mg total) by mouth 2 (two) times daily for 7 days.  ? ferrous sulfate 325 (65 FE) MG tablet Take by mouth.  ? fexofenadine (ALLEGRA) 180 MG tablet Take by mouth.  ? FLUZONE HIGH-DOSE QUADRIVALENT 0.7 ML SUSY   ? hydrochlorothiazide (HYDRODIURIL) 25 MG tablet TAKE 1 TABLET (25 MG TOTAL) BY MOUTH DAILY.  ? lisinopril (ZESTRIL) 10 MG tablet TAKE 1 TABLET BY MOUTH EVERY  DAY  ? Multiple Vitamin (MULTIVITAMIN) capsule Take 1 capsule by mouth daily.  ? PFIZER COVID-19 VAC BIVALENT injection   ? rosuvastatin (CRESTOR) 5 MG tablet Take 1 tablet (5 mg total) by mouth at bedtime.  ? vitamin C (ASCORBIC ACID) 500 MG tablet Take 500 mg by mouth daily.  ? ? ?Allergies: ?Patient is allergic to bee venom. ?Family History: ?Patient family history includes Breast cancer in her paternal aunt; Cancer in her mother; Diabetes in her brother; Early death in her paternal grandmother; Heart disease in her father and paternal grandfather; Hypertension in her father and mother. ?Social History:  ?Patient  reports that she has never smoked. She has never used smokeless tobacco. She reports that she does not drink alcohol and does not use drugs. ? ?Review of  Systems: ?Constitutional: Negative for fever malaise or anorexia ?Cardiovascular: negative for chest pain ?Respiratory: negative for SOB or persistent cough ?Gastrointestinal: negative for abdominal pain ? ?Objective  ?Vitals: BP 120/80   Pulse 75   Temp (!) 97.3 ?F (36.3 ?C)   Ht '5\' 9"'$  (1.753 m)   Wt 162 lb 6.4 oz (73.7 kg)   SpO2 97%   BMI 23.98 kg/m?  ?General: no acute distress , A&Ox3 ?Skin:  Warm, no rashes ?Right lower back: Embedded partially removed tick noted with 2 x 4 erythematous area.  No heat or drainage.  Nontender.  No other rash noted. ? ?Tick removal Procedure Note ? ?Pre-operative Diagnosis: foreign body (tick, partially removed) ? ?Post-operative Diagnosis: same ? ?Locations:lower right back  ? ?Indications: embedded tick parts with FB reaction ? ?Anesthesia:  none ? ?Procedure Details  ?The risks (including bleeding and infection) and benefits of the procedure and Verbal informed consent obtained. The area was cleansed with alcohol swabs.  Using an 18-gauge needle, the skin above the foreign body was unroofed and the remaining parts of the tick were removed.  There was no bleeding.  Patient tolerated the procedure well.  ? ?Findings: ?Partial tic piece ? ?Condition: ?Stable ? ?Complications: ?none. ? ? ?Commons side effects, risks, benefits, and alternatives for medications and treatment plan prescribed today were discussed, and the patient expressed understanding of the given instructions. Patient is instructed to call or message via MyChart if he/she has any questions or concerns regarding our treatment plan. No barriers to understanding were identified. We discussed Red Flag symptoms and signs in detail. Patient expressed understanding regarding what to do in case of urgent or emergency type symptoms.  ?Medication list was reconciled, printed and provided to the patient in AVS. Patient instructions and summary information was reviewed with the patient as documented in the AVS. ?This  note was prepared with assistance of Systems analyst. Occasional wrong-word or sound-a-like substitutions may have occurred due to the inherent limitations of voice recognition software ? ?This visit occurred during the SARS-CoV-2 public health emergency.  Safety protocols were in place, including screening questions prior to the visit, additional usage of staff PPE, and extensive cleaning of exam room while observing appropriate contact time as indicated for disinfecting solutions.  ? ?

## 2021-09-14 ENCOUNTER — Ambulatory Visit
Admission: RE | Admit: 2021-09-14 | Discharge: 2021-09-14 | Disposition: A | Discharge status: Home / Self Care | Payer: Medicare HMO | Source: Ambulatory Visit | Attending: Family Medicine | Admitting: Family Medicine

## 2021-09-14 ENCOUNTER — Other Ambulatory Visit: Payer: Self-pay | Admitting: Family Medicine

## 2021-09-14 DIAGNOSIS — N2889 Other specified disorders of kidney and ureter: Secondary | ICD-10-CM

## 2021-09-14 DIAGNOSIS — R160 Hepatomegaly, not elsewhere classified: Secondary | ICD-10-CM

## 2021-09-14 DIAGNOSIS — K760 Fatty (change of) liver, not elsewhere classified: Secondary | ICD-10-CM | POA: Diagnosis not present

## 2021-09-14 MED ORDER — IOPAMIDOL (ISOVUE-300) INJECTION 61%
100.0000 mL | Freq: Once | INTRAVENOUS | Status: AC | PRN
  Administered 2021-09-14: 100 mL via INTRAVENOUS

## 2021-09-17 ENCOUNTER — Encounter: Payer: Self-pay | Admitting: Family Medicine

## 2021-09-17 DIAGNOSIS — K7689 Other specified diseases of liver: Secondary | ICD-10-CM | POA: Insufficient documentation

## 2021-09-30 ENCOUNTER — Encounter (HOSPITAL_COMMUNITY): Payer: Self-pay | Admitting: Emergency Medicine

## 2021-09-30 ENCOUNTER — Other Ambulatory Visit: Payer: Self-pay

## 2021-09-30 ENCOUNTER — Ambulatory Visit (HOSPITAL_COMMUNITY)
Admission: EM | Admit: 2021-09-30 | Discharge: 2021-09-30 | Disposition: A | Discharge status: Home / Self Care | Payer: Medicare HMO | Attending: Family Medicine | Admitting: Family Medicine

## 2021-09-30 DIAGNOSIS — S80262A Insect bite (nonvenomous), left knee, initial encounter: Secondary | ICD-10-CM | POA: Diagnosis not present

## 2021-09-30 DIAGNOSIS — W57XXXA Bitten or stung by nonvenomous insect and other nonvenomous arthropods, initial encounter: Secondary | ICD-10-CM | POA: Diagnosis not present

## 2021-09-30 MED ORDER — DOXYCYCLINE HYCLATE 100 MG PO CAPS
100.0000 mg | ORAL_CAPSULE | Freq: Two times a day (BID) | ORAL | 0 refills | Status: AC
Start: 2021-09-30 — End: 2021-10-07

## 2021-09-30 NOTE — Discharge Instructions (Addendum)
Take doxycycline 100 mg --1 capsule 2 times daily for 7 days 

## 2021-09-30 NOTE — ED Provider Notes (Signed)
[facility]    CSN: [id number] Arrival date & time: 09/30/21  1019      History   Chief Complaint Chief Complaint  Patient presents with   Tick Removal    HPI Joyce Hatfield is a 76 y.o. female.   HPI Here for a tick bite. This morning when she awoke she discovered a tick on the back of her knee.  Her husband helped to remove it.  It was not there last night when she showered. 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      Home Medications    Prior to Admission medications   Medication Sig Start Date End Date Taking? Authorizing Provider  doxycycline (VIBRAMYCIN) 100 MG capsule Take 1 capsule (100 mg total) by mouth 2 (two) times daily for 7 days. 09/30/21 10/07/21 Yes Barrett Henle, MD  Calcium Carbonate-Vitamin D 600-400 MG-UNIT tablet Take 1 tablet by mouth daily.    [provider]   Cholecalciferol (VITAMIN D) 1000 UNITS capsule Take 1,000 Units by mouth daily.    [provider]  Continuous Blood Gluc Receiver (FREESTYLE LIBRE 14 DAY READER) DEVI Use as directed 07/04/21   Leamon Arnt, MD  Continuous Blood Gluc Sensor (FREESTYLE LIBRE 3 SENSOR) MISC Apply 1 each topically every 14 (fourteen) days. 07/04/21   Leamon Arnt, MD  ferrous sulfate 325 (65 FE) MG tablet Take by mouth.    [provider]  fexofenadine (ALLEGRA) 180 MG tablet Take by mouth. 09/28/15   [provider]  FLUZONE HIGH-DOSE QUADRIVALENT 0.7 ML SUSY  03/01/21   [provider]  hydrochlorothiazide (HYDRODIURIL) 25 MG tablet TAKE 1 TABLET (25 MG TOTAL) BY MOUTH DAILY. 06/04/21   Leamon Arnt, MD  lisinopril (ZESTRIL) 10 MG tablet TAKE 1 TABLET BY MOUTH EVERY DAY 06/11/21   Leamon Arnt, MD  Multiple Vitamin (MULTIVITAMIN) capsule Take 1 capsule by mouth daily.    [provider]  PFIZER COVID-19 VAC BIVALENT injection  01/15/21   [provider]  rosuvastatin (CRESTOR) 5 MG tablet Take 1 tablet (5 mg total) by mouth at bedtime. 06/04/21   Leamon Arnt, MD  vitamin C (ASCORBIC ACID) 500 MG tablet Take 500 mg by mouth daily.    [provider]    Family History Family History  Problem Relation Age of Onset   Cancer Mother    Hypertension Mother    Heart disease Father    Hypertension Father    Diabetes Brother    Breast cancer Paternal Aunt    Early death Paternal Grandmother    Heart disease Paternal Grandfather     Social History Social History   Tobacco Use   Smoking status: Never   Smokeless tobacco: Never  Vaping Use   Vaping Use: Never used  Substance Use Topics   Alcohol use: No    Comment: OCCASIONALLY   Drug use: Never     Allergies   Bee venom   Review of Systems Review of Systems   Physical Exam Triage Vital Signs ED Triage Vitals  Enc Vitals Group     BP 09/30/21 1115 125/77     Pulse Rate  09/30/21 1115 63     Resp 09/30/21 1115 18     Temp 09/30/21 1115 98.9 F (37.2 C)     Temp Source 09/30/21 1115 Oral     SpO2 09/30/21 1115 96 %     Weight --      Height --      Head Circumference --      Peak Flow --      Pain Score 09/30/21 1113 0     Pain Loc --      Pain Edu? --      Excl. in [place]? --    No data found.  Updated Vital Signs BP 125/77 (BP Location: Right Arm)   Pulse 63   Temp 98.9 F (37.2 C) (Oral)   Resp 18   SpO2 96%   Visual Acuity Right Eye Distance:   Left Eye Distance:   Bilateral Distance:    Right Eye Near:   Left Eye Near:    Bilateral Near:     Physical Exam Vitals reviewed.  Constitutional:      General: She is not in acute distress.    Appearance: She is not ill-appearing, toxic-appearing or diaphoretic.  Skin:    Coloration: Skin is not jaundiced or pale.     Comments: There is an area of mild erythema about 5 cm in diameter.  It is not raised or indurated.  There is no drainage or ulceration  Neurological:     Mental Status: She is alert and oriented to person, place, and time.  Psychiatric:        Behavior: Behavior normal.     UC Treatments / Results  Labs (all labs ordered are listed, but only abnormal results are displayed) Labs Reviewed - No data to display  EKG   Radiology No results found.  Procedures Procedures (including critical care time)  Medications Ordered in UC Medications - No data to display  Initial Impression / Assessment and Plan / UC Course  I have reviewed the triage vital signs and the nursing notes.  Pertinent labs & imaging results that were available during my care of the patient were reviewed by me and considered in my medical decision making (see chart for details).     I will send in doxycycline for Lyme prophylaxis. Final Clinical Impressions(s) / UC Diagnoses   Final diagnoses:  Tick bite of left knee, initial encounter     Discharge Instructions      Take doxycycline  100 mg--1 capsule 2 times daily for 7 days     ED Prescriptions  Medication Sig Dispense Auth. Provider   doxycycline (VIBRAMYCIN) 100 MG capsule Take 1 capsule (100 mg total) by mouth 2 (two) times daily for 7 days. 14 capsule Windy Carina, Gwenlyn Perking, MD      PDMP not reviewed this encounter.   Barrett Henle, MD 09/30/21 1144

## 2021-09-30 NOTE — ED Triage Notes (Signed)
Reports husband removed a tick from back of left knee around 6:30 this morning.  Reports it was not there when she showered last night.  Husband removed tick.  Site is red.

## 2021-10-04 ENCOUNTER — Ambulatory Visit (INDEPENDENT_AMBULATORY_CARE_PROVIDER_SITE_OTHER): Payer: Medicare HMO | Admitting: Family Medicine

## 2021-10-04 ENCOUNTER — Encounter: Payer: Self-pay | Admitting: Family Medicine

## 2021-10-04 VITALS — BP 130/62 | HR 74 | Temp 98.7°F | Ht 69.0 in | Wt 160.0 lb

## 2021-10-04 DIAGNOSIS — R0989 Other specified symptoms and signs involving the circulatory and respiratory systems: Secondary | ICD-10-CM | POA: Diagnosis not present

## 2021-10-04 DIAGNOSIS — E119 Type 2 diabetes mellitus without complications: Secondary | ICD-10-CM | POA: Diagnosis not present

## 2021-10-04 DIAGNOSIS — I10 Essential (primary) hypertension: Secondary | ICD-10-CM

## 2021-10-04 DIAGNOSIS — I471 Supraventricular tachycardia: Secondary | ICD-10-CM | POA: Diagnosis not present

## 2021-10-04 DIAGNOSIS — E1169 Type 2 diabetes mellitus with other specified complication: Secondary | ICD-10-CM | POA: Insufficient documentation

## 2021-10-04 DIAGNOSIS — E782 Mixed hyperlipidemia: Secondary | ICD-10-CM

## 2021-10-04 DIAGNOSIS — K760 Fatty (change of) liver, not elsewhere classified: Secondary | ICD-10-CM | POA: Diagnosis not present

## 2021-10-04 HISTORY — DX: Fatty (change of) liver, not elsewhere classified: K76.0

## 2021-10-04 LAB — POCT GLYCOSYLATED HEMOGLOBIN (HGB A1C): Hemoglobin A1C: 6.4 % — AB (ref 4.0–5.6)

## 2021-10-04 NOTE — Progress Notes (Signed)
EKG: sinus rhythm with PAT and old septal Q waves. No comparison

## 2021-10-04 NOTE — Patient Instructions (Addendum)
Please return in 3 months for your annual complete physical; please come fasting.   If you have any questions or concerns, please don't hesitate to send me a message via MyChart or call the office at [phone]. Thank you for visiting with us today! It's our pleasure caring for you.  

## 2021-10-04 NOTE — Progress Notes (Signed)
Subjective   CC:  Chief Complaint  Patient presents with   Diabetes    Pt here for 100mo F/U with DM    HPI: Joyce IANNELLIis a 76y.o. female who presents to the office today for follow up of diabetes and problems listed above in the chief complaint.  Diabetes follow up: Her diabetic control is reported as Improved. Using CGM and sugars are much improved. Eating keto (although prefers healthy vegetarian diet).  She denies exertional CP or SOB or symptomatic hypoglycemia. She denies foot sores or paresthesias.  HLD: has stopped crestor x 2 months to see how it affects her sugars. No AE. Willing to restart.  HTN: on lisinopril 10 daily. Feels well. No orthostatic sxs. No cp but admits to rare and intermittent palpitations w/o associated sxs   Wt Readings from Last 3 Encounters:  10/04/21 160 lb (72.6 kg)  08/14/21 162 lb 6.4 oz (73.7 kg)  07/04/21 164 lb 9.6 oz (74.7 kg)    BP Readings from Last 3 Encounters:  10/04/21 130/62  09/30/21 125/77  08/14/21 120/80    Assessment  1. Controlled type 2 diabetes mellitus without complication, without long-term current use of insulin ([place]   2. Essential hypertension   3. Pulse irregularity   4. PAT (paroxysmal atrial tachycardia) ([place]   5. Combined hyperlipidemia associated with type 2 diabetes mellitus ([place]   6. Hepatic steatosis      Plan  Diabetes is currently very well controlled. Had long discussion regarding "best" diets, indications for diabetic medications, affects of statins. She will continue keto for now.  HLD: restart crestor and monitor sugars. Will need to recheck lipids at next visit. Discussed benefits of statins.  HTN: controlled on lisinopril Irregular pulse: SR with PAT on EKG with rare mild sxs. Will monitor. Consider bb.   Follow up: 3 mo for cpe and f/u. Orders Placed This Encounter  Procedures   POCT HgB A1C   EKG 12-Lead   No orders of the defined types were placed in this encounter.      Immunization History  Administered Date(s) Administered   Fluad Quad(high Dose 65+) 03/01/2021   Influenza Inj Mdck Quad Pf 05/05/2018   Influenza, High Dose Seasonal PF 02/16/2016, 02/05/2017, 02/03/2019   Influenza-Unspecified 02/24/2020   PFIZER(Purple Top)SARS-COV-2 Vaccination 06/05/2019, 06/30/2019, 02/04/2020, 09/05/2020   Pfizer Covid-19 Vaccine Bivalent Booster 18yr& up 01/15/2021   Pneumococcal Conjugate-13 06/15/2013   Pneumococcal Polysaccharide-23 09/28/2015   Tdap 10/23/2006, 04/04/2017   Zoster Recombinat (Shingrix) 10/09/2016, 12/22/2016   Zoster, Live 04/30/2007, 03/05/2010    Diabetes Related Lab Review: Lab Results  Component Value Date   HGBA1C 6.4 (A) 10/04/2021   HGBA1C 10.8 (A) 07/04/2021   HGBA1C 6.9 (A) 01/03/2021    Lab Results  Component Value Date   MICROALBUR 2.4 (H) 07/04/2021   Lab Results  Component Value Date   CREATININE 0.70 07/04/2021   BUN 24 (H) 07/04/2021   NA 136 07/04/2021   K 4.5 07/04/2021   CL 99 07/04/2021   CO2 28 07/04/2021   Lab Results  Component Value Date   CHOL 169 07/04/2021   CHOL 215 (H) 01/03/2021   CHOL 227 (H) 06/14/2020   Lab Results  Component Value Date   HDL 80.20 07/04/2021   HDL 63.30 01/03/2021   HDL 65.00 06/14/2020   Lab Results  Component Value Date   LDLCALC 73 07/04/2021   LDLCALC 131 (H) 01/03/2021   LDLCALC 125 (H) 06/14/2020  Lab Results  Component Value Date   TRIG 82.0 07/04/2021   TRIG 107.0 01/03/2021   TRIG 182.0 (H) 06/14/2020   Lab Results  Component Value Date   CHOLHDL 2 07/04/2021   CHOLHDL 3 01/03/2021   CHOLHDL 3 06/14/2020   No results found for: "LDLDIRECT" The 10-year ASCVD risk score (Arnett DK, et al., 2019) is: 30%   Values used to calculate the score:     Age: 31 years     Sex: Female     Is Non-Hispanic African American: No     Diabetic: Yes     Tobacco smoker: No     Systolic Blood Pressure: 497 mmHg     Is BP treated: Yes     HDL Cholesterol:  80.2 mg/dL     Total Cholesterol: 169 mg/dL I have reviewed the PMH, Fam and Soc history. 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Joyce Hatfield) 10/04/2021    Priority: High   Controlled type 2 diabetes mellitus without complication, without long-term current use of insulin (Joyce Hatfield) 06/26/2020    Priority: High   Hx of melanoma excision 11/13/2018    Priority: High   OSA on CPAP 09/08/2014    Priority: High   Essential hypertension 03/16/2014    Priority: High   Serrated adenoma of colon 11/08/2013    Priority: High    2015 sessile serrated adenoma size ? And tubular adenoma recheck 5 yr    Restless legs 11/12/2018    Priority: Medium    Osteoporosis, postmenopausal 05/05/2018    Priority: Medium     Dexa has been sl osteopenic. Was treated with fosamax x 5 years, then drug holiday, then restarted 2018. Drug holiday starting 2023  10/2020: T = -2.4 femur. Osteopenia.  09/25/2016 FRAX 19/3.4 ; low impact distal radius fracture. 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  See records from [facility]    Hepatic steatosis 10/04/2021    Ultrasound 2022    Liver cyst 09/17/2021    Benign, incidental finding on ultrasound and f/u ct confirmed. 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 She has never used smokeless tobacco. She reports that she does not drink alcohol and does not use drugs.  Review of Systems: Ophthalmic: negative for eye pain, loss of vision or double vision Cardiovascular: negative for chest pain Respiratory: negative for SOB or persistent cough Gastrointestinal: negative for  abdominal pain Genitourinary: negative for dysuria or gross hematuria MSK: negative for foot lesions Neurologic: negative for weakness or gait disturbance  Objective  Vitals: BP 130/62 Comment: irreg pulse  Pulse 74   Temp 98.7 F (37.1 C)   Ht '5\' 9"'$  (1.753 m)   Wt 160 lb (72.6 kg)   SpO2 97%   BMI 23.63 kg/m  General: well appearing, no acute distress  Psych:  Alert and oriented, normal mood and affect HEENT:  Normocephalic, atraumatic, moist mucous membranes, supple neck  Cardiovascular:  Nl S1 and S2, irregular pulse without murmur, gallop or rub. no edema Respiratory:  Good breath sounds  bilaterally, CTAB with normal effort, no rales  EKG: sinus rhythm with PAT and old septal Q waves. No comparison   Diabetic education: ongoing education regarding chronic disease management for diabetes was given today. We continue to reinforce the ABC's of diabetic management: A1c (<7 or 8 dependent upon patient), tight blood pressure control, and cholesterol management with goal LDL < 100 minimally. We discuss diet strategies, exercise recommendations, medication options and possible side effects. At each visit, we review recommended immunizations and preventive care recommendations for diabetics and stress that good diabetic control can prevent other problems. See below for this patient's data.   Commons side effects, risks, benefits, and alternatives for medications and treatment plan prescribed today were discussed, and the patient expressed understanding of the given instructions. Patient is instructed to call or message via MyChart if he/she has any questions or concerns regarding our treatment plan. No barriers to understanding were identified. We discussed Red Flag symptoms and signs in detail. Patient expressed understanding regarding what to do in case of urgent or emergency type symptoms.  Medication list was reconciled, printed and provided to the patient in AVS. Patient instructions and  summary information was reviewed with the patient as documented in the AVS. This note was prepared with assistance of Dragon voice recognition software. Occasional wrong-word or sound-a-like substitutions may have occurred due to the inherent limitations of voice recognition software  This visit occurred during the SARS-CoV-2 public health emergency.  Safety protocols were in place, including screening questions prior to the visit, additional usage of staff PPE, and extensive cleaning of exam room while observing appropriate contact time as indicated for disinfecting solutions.

## 2021-12-03 ENCOUNTER — Other Ambulatory Visit: Payer: Self-pay | Admitting: Family Medicine

## 2021-12-03 DIAGNOSIS — I10 Essential (primary) hypertension: Secondary | ICD-10-CM

## 2021-12-14 DIAGNOSIS — G4733 Obstructive sleep apnea (adult) (pediatric): Secondary | ICD-10-CM | POA: Diagnosis not present

## 2022-01-02 ENCOUNTER — Encounter: Payer: Self-pay | Admitting: Family Medicine

## 2022-01-04 ENCOUNTER — Encounter: Payer: Self-pay | Admitting: Family Medicine

## 2022-01-04 ENCOUNTER — Ambulatory Visit (INDEPENDENT_AMBULATORY_CARE_PROVIDER_SITE_OTHER): Payer: Medicare HMO | Admitting: Family Medicine

## 2022-01-04 VITALS — BP 114/82 | HR 72 | Temp 98.1°F | Ht 69.0 in | Wt 161.2 lb

## 2022-01-04 DIAGNOSIS — E1169 Type 2 diabetes mellitus with other specified complication: Secondary | ICD-10-CM | POA: Diagnosis not present

## 2022-01-04 DIAGNOSIS — E119 Type 2 diabetes mellitus without complications: Secondary | ICD-10-CM | POA: Diagnosis not present

## 2022-01-04 DIAGNOSIS — D126 Benign neoplasm of colon, unspecified: Secondary | ICD-10-CM | POA: Diagnosis not present

## 2022-01-04 DIAGNOSIS — M810 Age-related osteoporosis without current pathological fracture: Secondary | ICD-10-CM | POA: Diagnosis not present

## 2022-01-04 DIAGNOSIS — Z0000 Encounter for general adult medical examination without abnormal findings: Secondary | ICD-10-CM

## 2022-01-04 DIAGNOSIS — E1159 Type 2 diabetes mellitus with other circulatory complications: Secondary | ICD-10-CM | POA: Diagnosis not present

## 2022-01-04 DIAGNOSIS — I152 Hypertension secondary to endocrine disorders: Secondary | ICD-10-CM | POA: Diagnosis not present

## 2022-01-04 DIAGNOSIS — E782 Mixed hyperlipidemia: Secondary | ICD-10-CM

## 2022-01-04 DIAGNOSIS — K760 Fatty (change of) liver, not elsewhere classified: Secondary | ICD-10-CM | POA: Diagnosis not present

## 2022-01-04 DIAGNOSIS — G4733 Obstructive sleep apnea (adult) (pediatric): Secondary | ICD-10-CM

## 2022-01-04 DIAGNOSIS — Z9989 Dependence on other enabling machines and devices: Secondary | ICD-10-CM | POA: Diagnosis not present

## 2022-01-04 LAB — CBC WITH DIFFERENTIAL/PLATELET
Basophils Absolute: 0 10*3/uL (ref 0.0–0.1)
Basophils Relative: 0.6 % (ref 0.0–3.0)
Eosinophils Absolute: 0.1 10*3/uL (ref 0.0–0.7)
Eosinophils Relative: 1.9 % (ref 0.0–5.0)
HCT: 41.9 % (ref 36.0–46.0)
Hemoglobin: 14.4 g/dL (ref 12.0–15.0)
Lymphocytes Relative: 25 % (ref 12.0–46.0)
Lymphs Abs: 1.4 10*3/uL (ref 0.7–4.0)
MCHC: 34.5 g/dL (ref 30.0–36.0)
MCV: 87.4 fl (ref 78.0–100.0)
Monocytes Absolute: 0.3 10*3/uL (ref 0.1–1.0)
Monocytes Relative: 4.7 % (ref 3.0–12.0)
Neutro Abs: 3.9 10*3/uL (ref 1.4–7.7)
Neutrophils Relative %: 67.8 % (ref 43.0–77.0)
Platelets: 204 10*3/uL (ref 150.0–400.0)
RBC: 4.79 Mil/uL (ref 3.87–5.11)
RDW: 13.4 % (ref 11.5–15.5)
WBC: 5.8 10*3/uL (ref 4.0–10.5)

## 2022-01-04 LAB — COMPREHENSIVE METABOLIC PANEL
ALT: 15 U/L (ref 0–35)
AST: 19 U/L (ref 0–37)
Albumin: 4.1 g/dL (ref 3.5–5.2)
Alkaline Phosphatase: 78 U/L (ref 39–117)
BUN: 28 mg/dL — ABNORMAL HIGH (ref 6–23)
CO2: 30 mEq/L (ref 19–32)
Calcium: 10 mg/dL (ref 8.4–10.5)
Chloride: 102 mEq/L (ref 96–112)
Creatinine, Ser: 0.8 mg/dL (ref 0.40–1.20)
GFR: 72.94 mL/min (ref >60.00)
Glucose, Bld: 122 mg/dL — ABNORMAL HIGH (ref 70–99)
Potassium: 4 mEq/L (ref 3.5–5.1)
Sodium: 139 mEq/L (ref 135–145)
Total Bilirubin: 1.2 mg/dL (ref 0.2–1.2)
Total Protein: 6.4 g/dL (ref 6.0–8.3)

## 2022-01-04 LAB — TSH: TSH: 0.74 u[IU]/mL (ref 0.35–5.50)

## 2022-01-04 LAB — HEMOGLOBIN A1C: Hgb A1c MFr Bld: 6.3 % (ref 4.6–6.5)

## 2022-01-04 LAB — LIPID PANEL
Cholesterol: 177 mg/dL (ref 0–200)
HDL: 74.1 mg/dL (ref >39.00)
LDL Cholesterol: 93 mg/dL (ref 0–99)
NonHDL: 102.84
Total CHOL/HDL Ratio: 2
Triglycerides: 51 mg/dL (ref 0.0–149.0)
VLDL: 10.2 mg/dL (ref 0.0–40.0)

## 2022-01-04 NOTE — Progress Notes (Addendum)
Subjective  Chief Complaint  Patient presents with   Annual Exam    Pt here for Annual exam and is currently fasting    HPI: Joyce Hatfield is a 76 y.o. female who presents to [facility] today for a Female Wellness Visit. She also has the concerns and/or needs as listed above in the chief complaint. These will be addressed in addition to the Health Maintenance Visit.   Wellness Visit: annual visit with health maintenance review and exam without Pap  HM: screens are all current. Reviewed DEXA results again. To get HD flu and covid once available. Active;healthy lifestyle Chronic disease f/u and/or acute problem visit: (deemed necessary to be done in addition to the wellness visit): DM: diet controlled with cgm; excellent control. No sxs. No retinopathy. On ace and statin.  HLD: restarted crestor 5 and tolerating well w/o elevations in sugars. Fasting for recheck.  Colonoscopy screens utd w/ h/o serrated adenoma Continues fosamax: treated for 5 years 2018-2023 with fosamax. Now on drug holiday again. Lowest T = -2.4 at left femure DEXA 2022. On d and calcium and avid hiker. Fatty liver w/o sxs. Eating low fat.   Assessment  1. Annual physical exam   2. Hypertension associated with diabetes ([place])   3. OSA on CPAP   4. Serrated adenoma of colon   5. Controlled type 2 diabetes mellitus without complication, without long-term current use of insulin ([place])   6. Combined hyperlipidemia associated with type 2 diabetes mellitus ([place])   7. Osteoporosis, postmenopausal   8. Gilbert disease   9. Hepatic steatosis      Plan  Female Wellness Visit: Age appropriate Health Maintenance and Prevention measures were discussed with patient. Included topics are cancer screening recommendations, ways to keep healthy (see AVS) including dietary and exercise recommendations, regular eye and dental care, use of seat belts, and avoidance of moderate alcohol use and tobacco use.   BMI: discussed patient's BMI and encouraged positive lifestyle modifications to help get to or maintain a target BMI. HM needs and immunizations were addressed and ordered. See below for orders. See HM and immunization section for updates. Will get flu and covid soon at pharmacy, and RSV Routine labs and screening tests ordered including cmp, cbc and lipids where appropriate.  Discussed recommendations regarding Vit D and calcium supplementation (see AVS)  Chronic disease management visit and/or acute problem visit: DM remains well controlled with diet. On ace and statin. Check lytes/renal and a1c. No eye or neuropathy concerns HTN is controlled on lisinopril 10 daily. Monitor renal and lytes Rechec fasting lipids on crstor 5 and lfts. Tolerating Continue drug holiday. Recheck dexa in 2 years Monitor lfts.  Colonoscopy q 5.   Follow up: 6 mo for diabetes and htn recheck  Orders Placed This Encounter  Procedures   CBC with Differential/Platelet   Comprehensive metabolic panel   Hemoglobin A1c   Lipid panel   TSH   No orders of the defined types were placed in this encounter.     Body mass index is 23.81 kg/m. Wt Readings from Last 3 Encounters:  01/04/22 161 lb 3.2 oz (73.1 kg)  10/04/21 160 lb (72.6 kg)  08/14/21 162 lb 6.4 oz (73.7 kg)     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[place]) 10/04/2021    Priority: High   Controlled type 2 diabetes mellitus without complication, without long-term current  use of insulin ([place]) 06/26/2020    Priority: High   Hx of melanoma excision 11/13/2018    Priority: High   OSA on CPAP 09/08/2014    Priority: High   Hypertension associated with diabetes ([place]) 03/16/2014    Priority: High   Serrated adenoma of colon 11/08/2013    Priority: High    2015 sessile serrated adenoma size ? And tubular adenoma recheck 5 yr    Restless legs 11/12/2018    Priority:  Medium    Osteoporosis, postmenopausal 05/05/2018    Priority: Medium     Dexa has been sl osteopenic. Was treated with fosamax x 5 years, then drug holiday, then restarted 2018. Drug holiday starting 2023  10/2020: T = -2.4 femur. Osteopenia.  09/25/2016 FRAX 19/3.4 ; low impact distal radius fracture. 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  See records from [place]    Hepatic steatosis 10/04/2021    Ultrasound 2022    Liver cyst 09/17/2021    Benign, incidental finding on ultrasound and f/u ct confirmed. Nothing further needed    [name] disease 01/04/2021    Suspect: elevated total bili; no sxs    Health Maintenance  Topic Date Due   COVID-19 Vaccine (6 - Pfizer series) 01/20/2022 (Originally 05/17/2021)   INFLUENZA VACCINE  07/28/2022 (Originally 11/27/2021)   OPHTHALMOLOGY EXAM  03/05/2022   HEMOGLOBIN A1C  04/05/2022   MAMMOGRAM  05/15/2022   FOOT EXAM  01/05/2023   DEXA SCAN  11/10/2023   COLONOSCOPY (Pts 45-30yr Insurance coverage will need to be confirmed)  01/29/2024   TETANUS/TDAP  04/05/2027   Pneumonia Vaccine 76 Years old  Completed   Hepatitis C Screening  Completed   Zoster Vaccines- Shingrix  Completed   HPV VACCINES  Aged Out   Immunization History  Administered Date(s) Administered   Fluad Quad(high Dose 65+) 03/01/2021   Influenza Inj Mdck Quad Pf 05/05/2018   Influenza, High Dose Seasonal PF 02/16/2016, 02/05/2017, 02/03/2019   Influenza-Unspecified 02/24/2020   PFIZER(Purple Top)SARS-COV-2 Vaccination 06/05/2019, 06/30/2019, 02/04/2020, 09/05/2020   Pfizer Covid-19 Vaccine Bivalent Booster 170yr& up 01/15/2021   Pneumococcal Conjugate-13 06/15/2013   Pneumococcal Polysaccharide-23 09/28/2015   Tdap 10/23/2006, 04/04/2017   Zoster Recombinat  (Shingrix) 10/09/2016, 12/22/2016   Zoster, Live 04/30/2007, 03/05/2010   We updated and reviewed the patient's past history in detail and it is documented below. Allergies: Patient is allergic to bee venom. Past Medical History Patient  has a past medical history of Cancer ([place](2008), Diabetes mellitus without complication ([place] Hepatic steatosis (10/04/2021), Hyperlipidemia, Hypertension, Osteoporosis, and Sleep apnea. Past Surgical History Patient  has a past [place] that [place] (1995); MELANOMA REMOVED (2008); ENDOMETRIOUSIS LAPAROSCOPY (1985); and Breast biopsy (Right, 2010). Family History: Patient family history includes Breast cancer in her paternal aunt; Cancer in her mother; Diabetes in her brother; Early death in her paternal grandmother; Heart disease in her father and paternal grandfather; Hypertension in her father and mother. Social History:  Patient  reports that she has never smoked. She has never used smokeless tobacco. She reports that she does not drink alcohol and does not use drugs.  Review of Systems: Constitutional: negative for fever or malaise Ophthalmic: negative for photophobia,  double vision or loss of vision Cardiovascular: negative for chest pain, dyspnea on exertion, or new LE swelling Respiratory: negative for SOB or persistent cough Gastrointestinal: negative for abdominal pain, change in bowel habits or melena Genitourinary: negative for dysuria or gross hematuria, no abnormal uterine bleeding or disharge Musculoskeletal: negative for new gait disturbance or muscular weakness Integumentary: negative for new or persistent rashes, no breast lumps Neurological: negative for TIA or stroke symptoms Psychiatric: negative for SI or delusions Allergic/Immunologic: negative for hives  Patient Care Team    Relationship Specialty Notifications Start End  Leamon Arnt, MD PCP - General Family Medicine  06/14/20      Objective  Vitals: BP 114/82   Pulse 72   Temp 98.1 F (36.7 C)   Ht '5\' 9"'$  (1.753 m)   Wt 161 lb 3.2 oz (73.1 kg)   SpO2 96%   BMI 23.81 kg/m  General:  Well developed, well nourished, no acute distress  Psych:  Alert and orientedx3,normal mood and affect HEENT:  Normocephalic, atraumatic, non-icteric sclera,  supple neck without adenopathy, mass or thyromegaly Cardiovascular:  Normal S1, S2, RRR without gallop, rub or murmur Respiratory:  Good breath sounds bilaterally, CTAB with normal respiratory effort Gastrointestinal: normal bowel sounds, soft, non-tender, no noted masses. No HSM MSK: no deformities, contusions. Joints are without erythema or swelling.  Skin:  Warm, no rashes or suspicious lesions noted   Commons side effects, risks, benefits, and alternatives for medications and treatment plan prescribed today were discussed, and the patient expressed understanding of the given instructions. Patient is instructed to call or message via MyChart if he/she has any questions or concerns regarding our treatment plan. No barriers to understanding were identified. We discussed Red Flag symptoms and signs in detail. Patient expressed understanding regarding what to do in case of urgent or emergency type symptoms.  Medication list was reconciled, printed and provided to the patient in AVS. Patient instructions and summary information was reviewed with the patient as documented in the AVS. This note was prepared with assistance of Dragon voice recognition software. Occasional wrong-word or sound-a-like substitutions may have occurred due to the inherent limitations of voice recognition software  This visit occurred during the SARS-CoV-2 public health emergency.  Safety protocols were in place, including screening questions prior to the visit, additional usage of staff PPE, and extensive cleaning of exam room while observing appropriate contact time as indicated for disinfecting solutions.

## 2022-01-04 NOTE — Patient Instructions (Signed)
Please return in 6 months for diabetes and blood pressure recheck  I will release your lab results to you on your MyChart account with further instructions. You may see the results before I do, but when I review them I will send you a message with my report or have my assistant call you if things need to be discussed. Please reply to my message with any questions. Thank you!   If you have any questions or concerns, please don't hesitate to send me a message via MyChart or call the office at [phone]. Thank you for visiting with us today! It's our pleasure caring for you.  

## 2022-02-04 ENCOUNTER — Encounter: Payer: Self-pay | Admitting: Family Medicine

## 2022-02-04 DIAGNOSIS — E1169 Type 2 diabetes mellitus with other specified complication: Secondary | ICD-10-CM

## 2022-03-06 DIAGNOSIS — E119 Type 2 diabetes mellitus without complications: Secondary | ICD-10-CM | POA: Diagnosis not present

## 2022-03-06 LAB — HM DIABETES EYE EXAM

## 2022-03-11 ENCOUNTER — Encounter: Payer: Self-pay | Admitting: Family Medicine

## 2022-03-11 DIAGNOSIS — L818 Other specified disorders of pigmentation: Secondary | ICD-10-CM | POA: Diagnosis not present

## 2022-03-11 DIAGNOSIS — Z85820 Personal history of malignant melanoma of skin: Secondary | ICD-10-CM | POA: Diagnosis not present

## 2022-03-11 DIAGNOSIS — L821 Other seborrheic keratosis: Secondary | ICD-10-CM | POA: Diagnosis not present

## 2022-03-11 DIAGNOSIS — Z08 Encounter for follow-up examination after completed treatment for malignant neoplasm: Secondary | ICD-10-CM | POA: Diagnosis not present

## 2022-04-25 ENCOUNTER — Other Ambulatory Visit (HOSPITAL_BASED_OUTPATIENT_CLINIC_OR_DEPARTMENT_OTHER): Payer: Self-pay | Admitting: Family Medicine

## 2022-04-25 DIAGNOSIS — Z1231 Encounter for screening mammogram for malignant neoplasm of breast: Secondary | ICD-10-CM

## 2022-05-06 ENCOUNTER — Other Ambulatory Visit: Payer: Self-pay | Admitting: Family Medicine

## 2022-05-13 DIAGNOSIS — R69 Illness, unspecified: Secondary | ICD-10-CM | POA: Diagnosis not present

## 2022-05-29 ENCOUNTER — Ambulatory Visit (HOSPITAL_BASED_OUTPATIENT_CLINIC_OR_DEPARTMENT_OTHER)
Admission: RE | Admit: 2022-05-29 | Discharge: 2022-05-29 | Disposition: A | Discharge status: Home / Self Care | Payer: Medicare HMO | Source: Ambulatory Visit | Attending: Family Medicine | Admitting: Family Medicine

## 2022-05-29 DIAGNOSIS — Z1231 Encounter for screening mammogram for malignant neoplasm of breast: Secondary | ICD-10-CM | POA: Diagnosis not present

## 2022-05-29 DIAGNOSIS — R69 Illness, unspecified: Secondary | ICD-10-CM | POA: Diagnosis not present

## 2022-05-29 IMAGING — US US ABDOMEN LIMITED
1 series · 14 of 25 positions shown · non-contrast
Comparison: None.

CLINICAL DATA: Elevated bilirubin

EXAM:
ULTRASOUND ABDOMEN LIMITED RIGHT UPPER QUADRANT

[Series 1: us abdomen limited · 0.22mm/px · 14 of 51 slices shown]
[im 1/51]
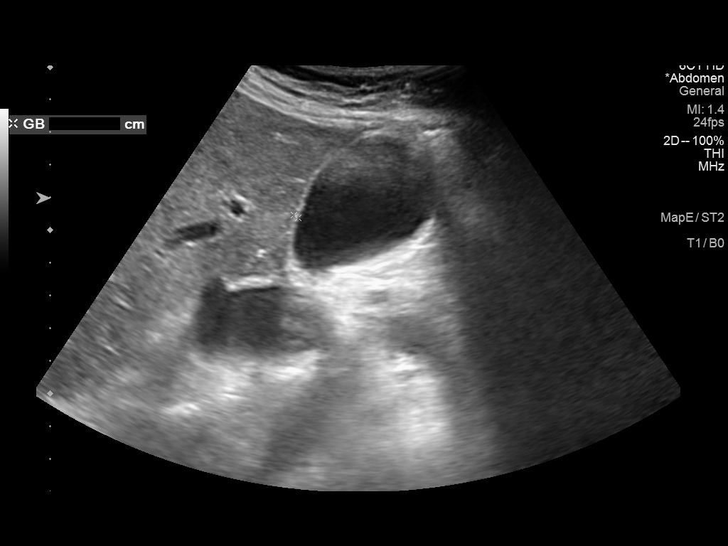
[im 5/51]
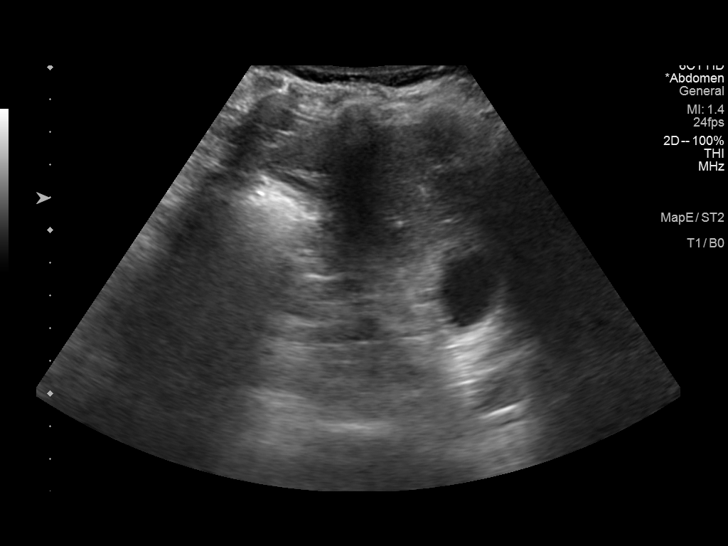
[im 9/51]
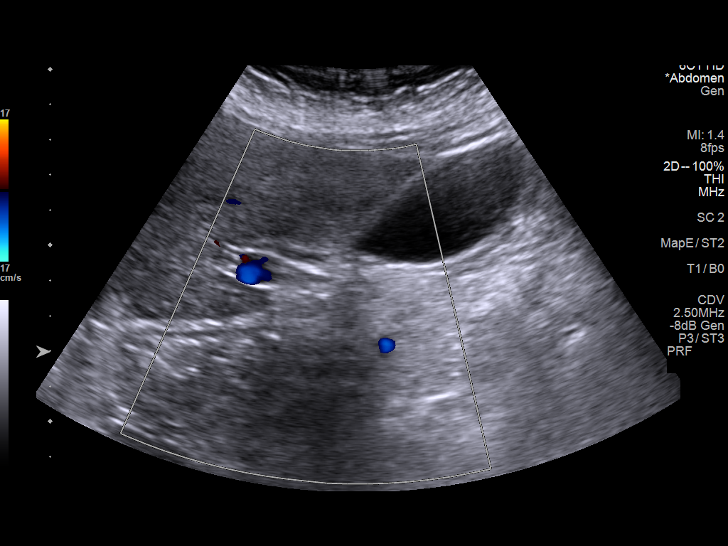
[im 13/51]
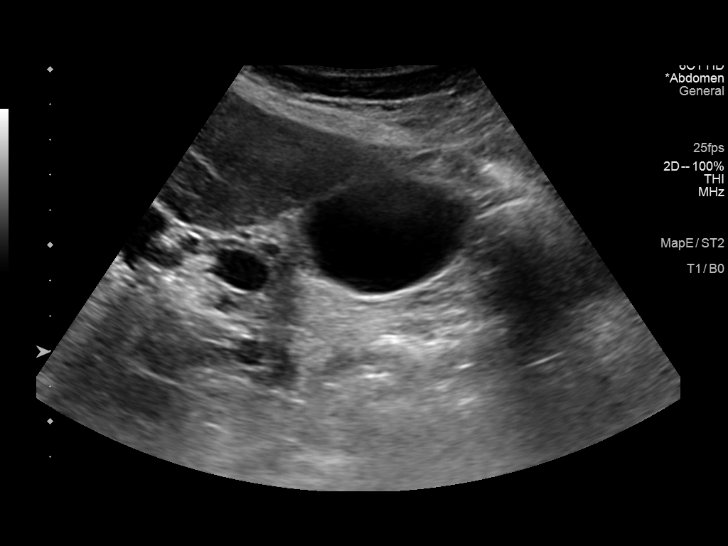
[im 17/51]
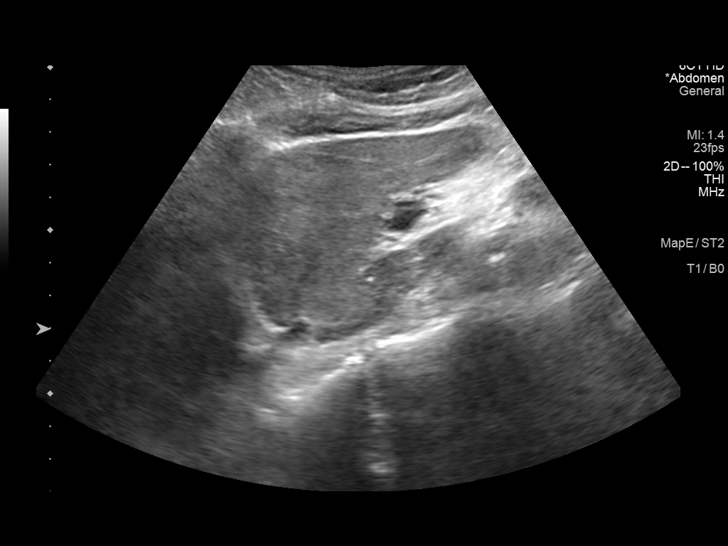
[im 19/51]
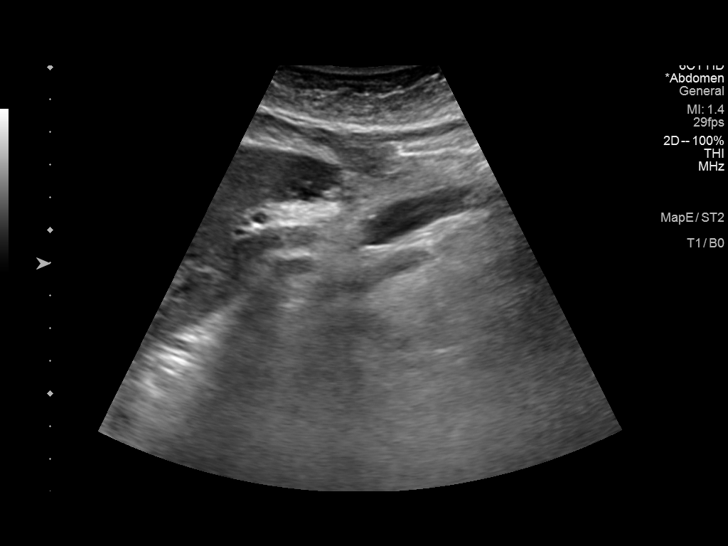
[im 23/51]
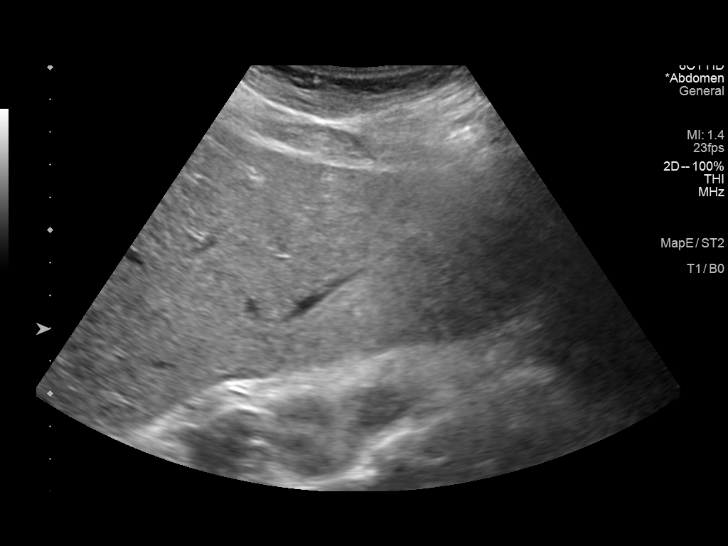
[im 28/51]
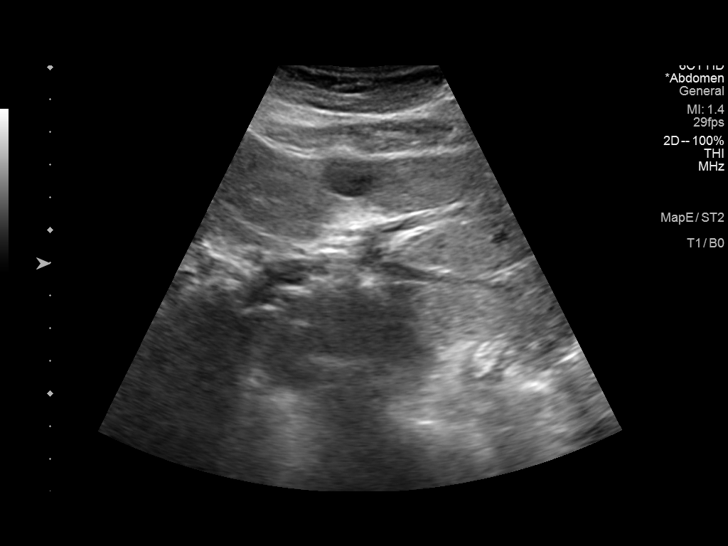
[im 32/51]
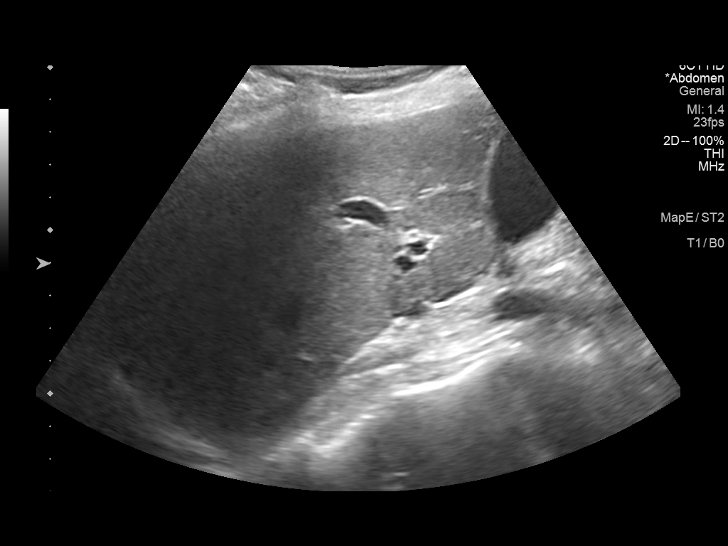
[im 34/51]
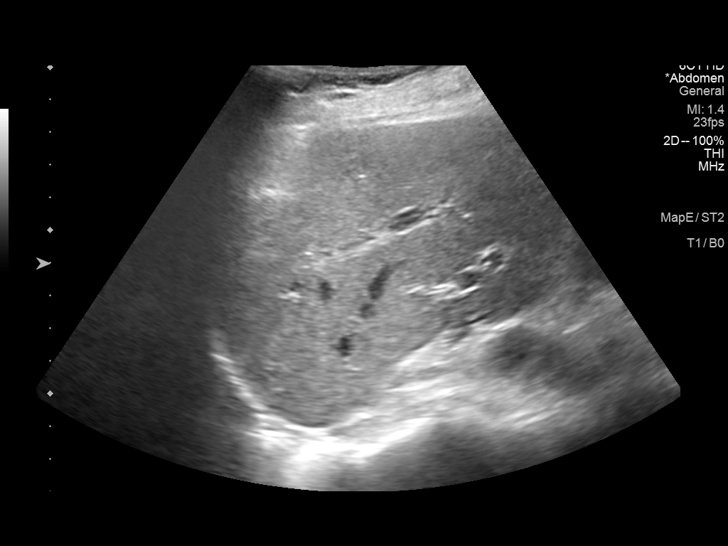
[im 38/51]
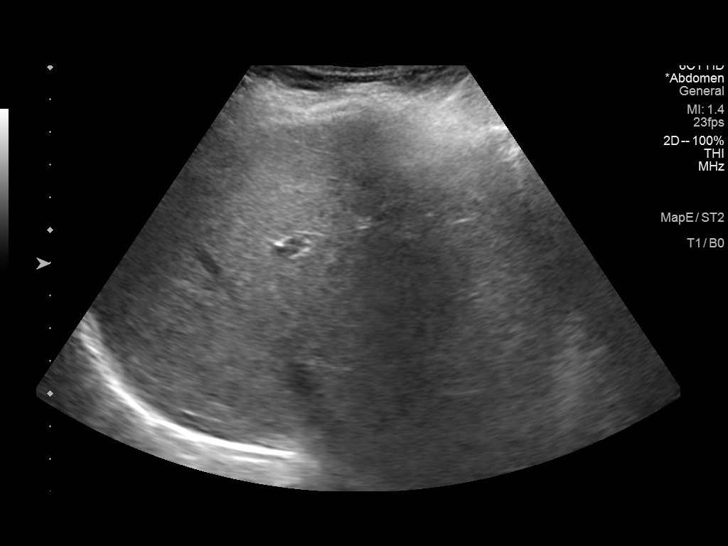
[im 42/51]
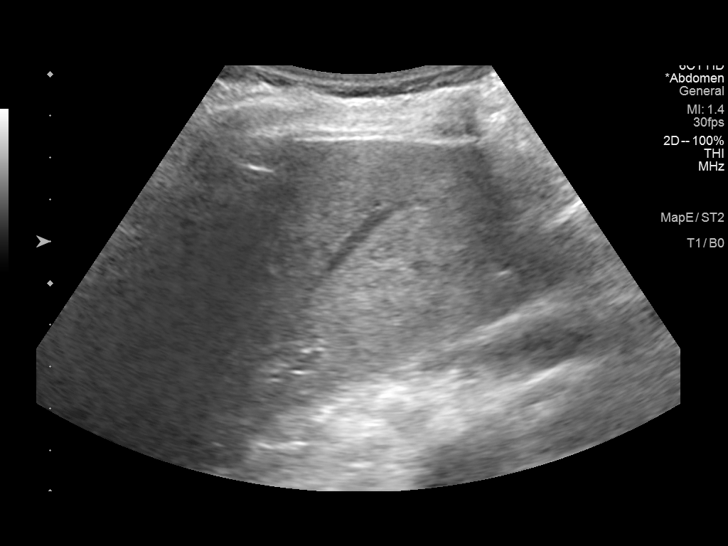
[im 46/51]
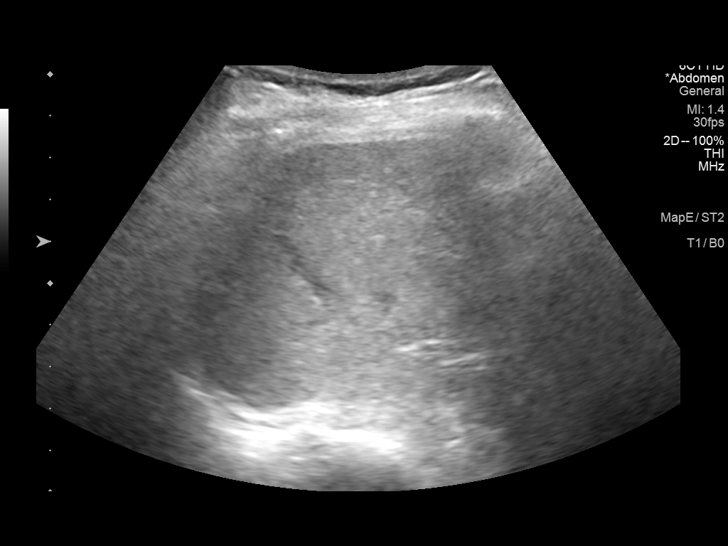
[im 51/51]
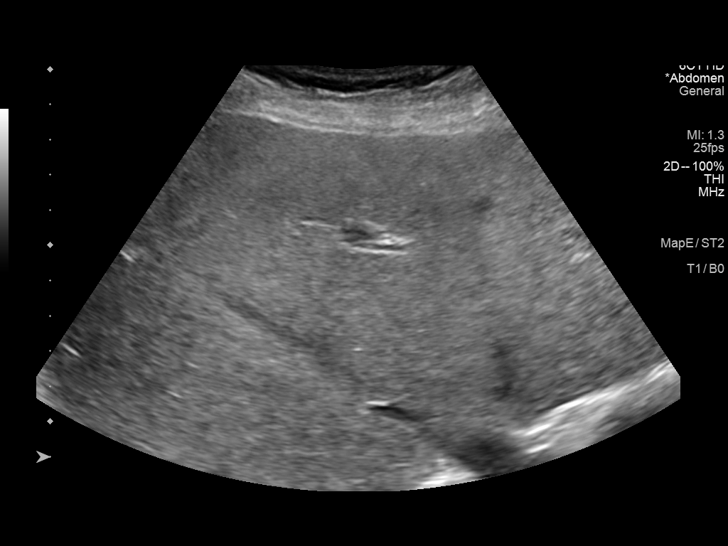

[14 of 25 positions shown; findings below may reference images not displayed]

FINDINGS: Gallbladder:

Gallstones: None

Sludge: None

Gallbladder Wall: Within normal limits

Pericholecystic fluid: None

Sonographic Murphy's Sign: Negative per technologist

Common bile duct:

Diameter: 2 mm

Liver:

Parenchymal echogenicity: Within normal limits

Lesions: 1.6 cm hypoechoic mass seen in the left hepatic lobe.

Portal vein: Patent.  Hepatopetal flow

Other: None.
IMPRESSION: 1. Diffuse increased echogenicity of the hepatic parenchyma is a
nonspecific indicator of hepatocellular dysfunction, most commonly
steatosis.
2. 1.6 cm hypoechoic mass in the left hepatic lobe cannot be
characterized as a simple cyst. Further evaluation with normal for
contrast-enhanced MRI or CT should be performed.

These results will be called to the ordering clinician or
representative by the Radiologist Assistant, and communication
documented in the PACS or [REDACTED].

## 2022-05-31 ENCOUNTER — Other Ambulatory Visit: Payer: Self-pay | Admitting: Family Medicine

## 2022-06-02 ENCOUNTER — Encounter: Payer: Self-pay | Admitting: Family Medicine

## 2022-06-03 MED ORDER — HYDROCHLOROTHIAZIDE 25 MG PO TABS: 25.0000 mg | ORAL_TABLET | Freq: Every day | ORAL | 3 refills | Status: DC

## 2022-06-03 NOTE — Addendum Note (Signed)
Addended by: Billey Chang on: 06/03/2022 01:55 PM   Modules accepted: Orders

## 2022-06-28 DIAGNOSIS — R69 Illness, unspecified: Secondary | ICD-10-CM | POA: Diagnosis not present

## 2022-07-01 ENCOUNTER — Ambulatory Visit (INDEPENDENT_AMBULATORY_CARE_PROVIDER_SITE_OTHER): Payer: Medicare HMO | Admitting: Family Medicine

## 2022-07-01 ENCOUNTER — Encounter: Payer: Self-pay | Admitting: Family Medicine

## 2022-07-01 VITALS — BP 110/78 | HR 65 | Temp 97.8°F | Ht 69.0 in | Wt 156.0 lb

## 2022-07-01 DIAGNOSIS — I152 Hypertension secondary to endocrine disorders: Secondary | ICD-10-CM | POA: Diagnosis not present

## 2022-07-01 DIAGNOSIS — E782 Mixed hyperlipidemia: Secondary | ICD-10-CM | POA: Diagnosis not present

## 2022-07-01 DIAGNOSIS — E1159 Type 2 diabetes mellitus with other circulatory complications: Secondary | ICD-10-CM

## 2022-07-01 DIAGNOSIS — Z8669 Personal history of other diseases of the nervous system and sense organs: Secondary | ICD-10-CM | POA: Insufficient documentation

## 2022-07-01 DIAGNOSIS — E1169 Type 2 diabetes mellitus with other specified complication: Secondary | ICD-10-CM | POA: Diagnosis not present

## 2022-07-01 DIAGNOSIS — E119 Type 2 diabetes mellitus without complications: Secondary | ICD-10-CM

## 2022-07-01 LAB — MICROALBUMIN / CREATININE URINE RATIO
Creatinine,U: 57.6 mg/dL
Microalb Creat Ratio: 2.9 mg/g (ref 0.0–30.0)
Microalb, Ur: 1.7 mg/dL (ref 0.0–1.9)

## 2022-07-01 LAB — POCT GLYCOSYLATED HEMOGLOBIN (HGB A1C): Hemoglobin A1C: 5.8 % — AB (ref 4.0–5.6)

## 2022-07-01 MED ORDER — ROSUVASTATIN CALCIUM 5 MG PO TABS: 5.0000 mg | ORAL_TABLET | Freq: Every evening | ORAL | 3 refills | Status: DC

## 2022-07-01 NOTE — Progress Notes (Signed)
Subjective  CC:  Chief Complaint  Patient presents with   Diabetes   Hypertension    HPI: Joyce Hatfield is a 77 y.o. female who presents to the office today for follow up of diabetes and problems listed above in the chief complaint.  Diabetes follow up: Her diabetic control is reported as Improved. Cgm shows 80-90% in range. Eats well. Active.  She denies exertional CP or SOB or symptomatic hypoglycemia. She denies foot sores or paresthesias.  Wt Readings from Last 3 Encounters:  07/01/22 156 lb (70.8 kg)  01/04/22 161 lb 3.2 oz (73.1 kg)  10/04/21 160 lb (72.6 kg)  HTN stable on ace and hctz. No cp or sob HLD on crestor 5; could not tolerate the 10due to myalgias.  No longer on cpap after weight loss.  Traveling to [place] for hiking. Needs medical necessity note for hiking poles.   BP Readings from Last 3 Encounters:  07/01/22 110/78  01/04/22 114/82  10/04/21 130/62    Assessment  1. Controlled type 2 diabetes mellitus without complication, without long-term current use of insulin ([place])   2. Combined hyperlipidemia associated with type 2 diabetes mellitus ([place])   3. Hypertension associated with diabetes ([place])   4. History of obstructive sleep apnea      Plan  Diabetes is currently well controlled. Continue diet and home glucose monitoring. HTN is controlled on lisinopril 10 and hctz 25 daily.  HLD on crestor 5 and tolerating it. Eats clean Medical necessity letter given  Follow up: 6 mo for cpe. Orders Placed This Encounter  Procedures   Microalbumin / creatinine urine ratio   POCT HgB A1C   Meds ordered this encounter  Medications   rosuvastatin (CRESTOR) 5 MG tablet    Sig: Take 1 tablet (5 mg total) by mouth at bedtime.    Dispense:  90 tablet    Refill:  3      Immunization History  Administered Date(s) Administered   Fluad Quad(high Dose 65+) 03/01/2021, 03/06/2022   Influenza Inj Mdck Quad Pf 05/05/2018   Influenza, High Dose Seasonal PF 02/16/2016,  02/05/2017, 02/03/2019   Influenza-Unspecified 02/24/2020   PFIZER Comirnaty(Gray Top)Covid-19 Tri-Sucrose Vaccine 01/25/2022   PFIZER(Purple Top)SARS-COV-2 Vaccination 06/05/2019, 06/30/2019, 02/04/2020, 09/05/2020   Pfizer Covid-19 Vaccine Bivalent Booster 15yr & up 01/15/2021   Pneumococcal Conjugate-13 06/15/2013   Pneumococcal Polysaccharide-23 09/28/2015   Respiratory Syncytial Virus Vaccine,Recomb Aduvanted(Arexvy) 02/15/2022   Tdap 10/23/2006, 04/04/2017   Zoster Recombinat (Shingrix) 10/09/2016, 12/22/2016   Zoster, Live 04/30/2007, 03/05/2010    Diabetes Related Lab Review: Lab Results  Component Value Date   HGBA1C 5.8 (A) 07/01/2022   HGBA1C 6.3 01/04/2022   HGBA1C 6.4 (A) 10/04/2021    Lab Results  Component Value Date   MICROALBUR 2.4 (H) 07/04/2021   Lab Results  Component Value Date   CREATININE 0.80 01/04/2022   BUN 28 (H) 01/04/2022   NA 139 01/04/2022   K 4.0 01/04/2022   CL 102 01/04/2022   CO2 30 01/04/2022   Lab Results  Component Value Date   CHOL 177 01/04/2022   CHOL 169 07/04/2021   CHOL 215 (H) 01/03/2021   Lab Results  Component Value Date   HDL 74.10 01/04/2022   HDL 80.20 07/04/2021   HDL 63.30 01/03/2021   Lab Results  Component Value Date   LDLCALC 93 01/04/2022   LDLCALC 73 07/04/2021   LDLCALC 131 (H) 01/03/2021   Lab Results  Component Value Date   TRIG 51.0  01/04/2022   TRIG 82.0 07/04/2021   TRIG 107.0 01/03/2021   Lab Results  Component Value Date   CHOLHDL 2 01/04/2022   CHOLHDL 2 07/04/2021   CHOLHDL 3 01/03/2021   No results found for: "LDLDIRECT" The 10-year ASCVD risk score (Arnett DK, et al., 2019) is: 25.5%   Values used to calculate the score:     Age: 17 years     Sex: Female     Is Non-Hispanic African American: No     Diabetic: Yes     Tobacco smoker: No     Systolic Blood Pressure: [id number] mmHg     Is BP treated: Yes     HDL Cholesterol: 74.1 mg/dL     Total Cholesterol: 177 mg/dL I have reviewed  the PMH, Fam and Soc history. 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[place]) 10/04/2021    Priority: High    Can't tolerate crestor 10; takes 5     Controlled type 2 diabetes mellitus without complication, without long-term current use of insulin ([place]) 06/26/2020    Priority: High   Hx of melanoma excision 11/13/2018    Priority: High   OSA on CPAP 09/08/2014    Priority: High   Hypertension associated with diabetes ([place]) 03/16/2014    Priority: High   Serrated adenoma of colon 11/08/2013    Priority: High    2015 sessile serrated adenoma size ? And tubular adenoma recheck 5 yr    History of obstructive sleep apnea 07/01/2022    Priority: Medium     Stopped cpap 2024 after 40 pound weight loss    Hepatic steatosis 10/04/2021    Priority: Medium     Ultrasound 2022    Restless legs 11/12/2018    Priority: Medium    Osteoporosis, postmenopausal 05/05/2018    Priority: Medium     Dexa has been sl osteopenic. Was treated with fosamax x 5 years, then drug holiday, then restarted 2018. Drug holiday starting 2023  10/2020: T = -2.4 femur. Osteopenia.  09/25/2016 FRAX 19/3.4 ; low impact distal radius fracture. 07/27/2013 DXA FRAX 15 / 1.7      Liver cyst 09/17/2021    Priority: Low    Benign, incidental finding on ultrasound and f/u ct confirmed. Nothing further needed    [name] disease 01/04/2021    Priority: Low    Suspect: elevated total bili; no sxs    Chronic allergic rhinitis 11/12/2018    Priority: Low   Arterial ectasia (HCC) - iliac artery 10/21/2015    Priority: Low    Mild iliac artery ectasia noted on ultrasound abdominal aorta to rule out aneurysm, 2017.  See records from [place] History: Patient  reports that she has never smoked. She has never used smokeless tobacco. She reports that she does not drink alcohol and does not use drugs.  Review of Systems: Ophthalmic:  negative for eye pain, loss of vision or double vision Cardiovascular: negative for chest pain Respiratory: negative for SOB or persistent cough Gastrointestinal: negative for abdominal pain Genitourinary: negative for dysuria or gross hematuria MSK: negative for foot lesions Neurologic: negative for weakness or gait disturbance  Objective  Vitals: BP 110/78   Pulse 65   Temp 97.8 F (36.6 C)   Ht '5\' 9"'$  (1.753 m)   Wt 156 lb (70.8 kg)   SpO2 98%   BMI 23.04 kg/m  General: well appearing,  no acute distress  Psych:  Alert and oriented, normal mood and affect HEENT:  Normocephalic, atraumatic, moist mucous membranes, supple neck  Cardiovascular:  Nl S1 and S2, RRR without murmur, gallop or rub. no edema Respiratory:  Good breath sounds bilaterally, CTAB with normal effort, no rales   Diabetic education: ongoing education regarding chronic disease management for diabetes was given today. We continue to reinforce the ABC's of diabetic management: A1c (<7 or 8 dependent upon patient), tight blood pressure control, and cholesterol management with goal LDL < 100 minimally. We discuss diet strategies, exercise recommendations, medication options and possible side effects. At each visit, we review recommended immunizations and preventive care recommendations for diabetics and stress that good diabetic control can prevent other problems. See below for this patient's data.   Commons side effects, risks, benefits, and alternatives for medications and treatment plan prescribed today were discussed, and the patient expressed understanding of the given instructions. Patient is instructed to call or message via MyChart if he/she has any questions or concerns regarding our treatment plan. No barriers to understanding were identified. We discussed Red Flag symptoms and signs in detail. Patient expressed understanding regarding what to do in case of urgent or emergency type symptoms.  Medication list was  reconciled, printed and provided to the patient in AVS. Patient instructions and summary information was reviewed with the patient as documented in the AVS. This note was prepared with assistance of Dragon voice recognition software. Occasional wrong-word or sound-a-like substitutions may have occurred due to the inherent limitations of voice recognition software

## 2022-07-01 NOTE — Patient Instructions (Signed)
Please return in 6 months for your annual complete physical; please come fasting.   If you have any questions or concerns, please don't hesitate to send me a message via MyChart or call the office at [phone]. Thank you for visiting with us today! It's our pleasure caring for you.  

## 2022-07-10 IMAGING — CT CT ABDOMEN WO/W CM
3 of 7 series · 12 of 32 positions shown, 17 images · IV contrast (APPLIED)
Comparison: Right upper quadrant ultrasound, 08/03/2021

CLINICAL DATA: Hepatic steatosis, left lobe liver mass incidentally
identified by prior ultrasound, characterize

EXAM:
CT ABDOMEN WITHOUT AND WITH CONTRAST
TECHNIQUE: Multidetector CT imaging of the abdomen was performed following the
standard protocol before and following the bolus administration of
intravenous contrast.

[Series 3: arterial · axial · arterial · 0.70mm/px · z∈[-245,-97]mm · 4 of 124 slices shown]
[im 25/124  soft-tissue]
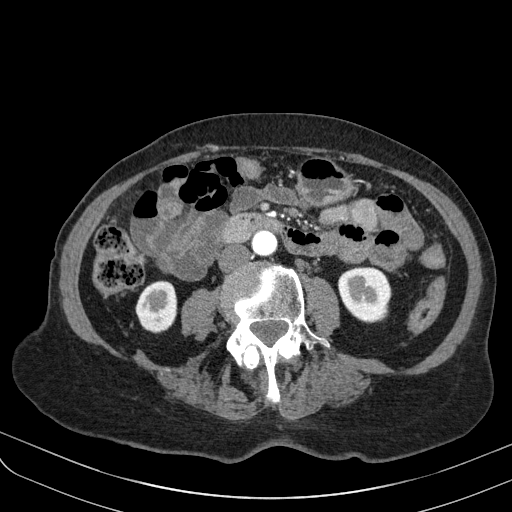
[im 50/124  soft-tissue]
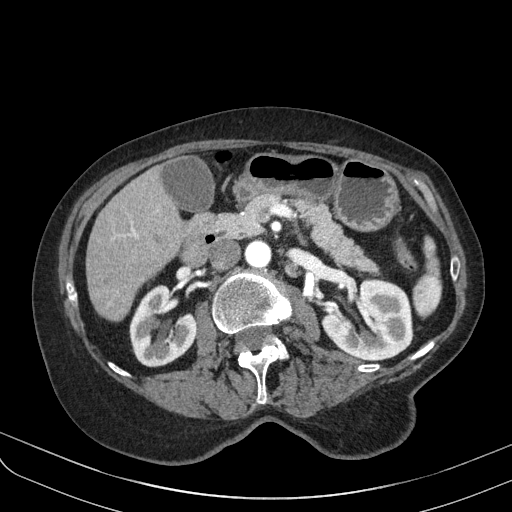
[im 74/124  soft-tissue]
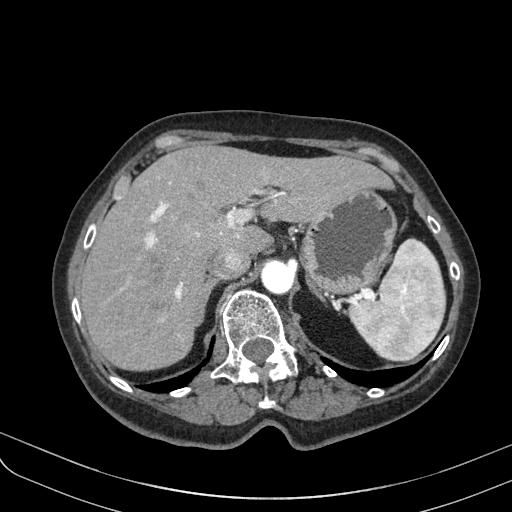
[im 99/124  soft-tissue]
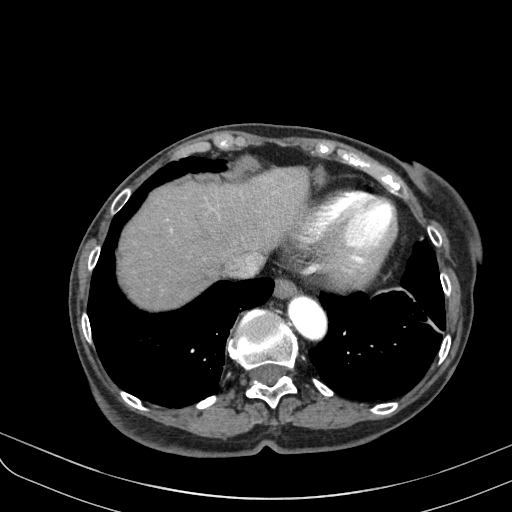

[Series 4: venous · axial · portal-venous · 0.69mm/px · z∈[-245,-97]mm · 4 of 124 slices shown, 9 images]
[im 25/124  soft-tissue]
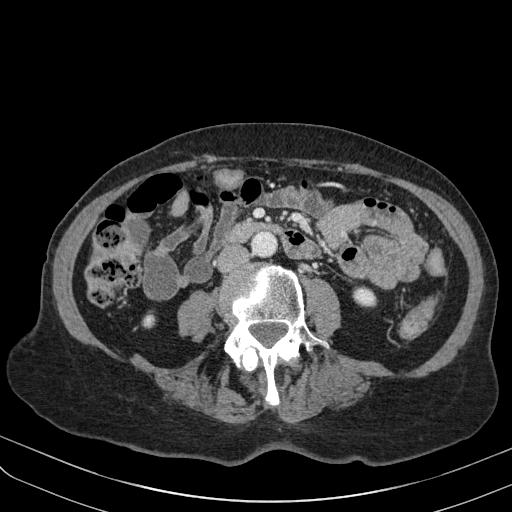
[im 25/124  lung]
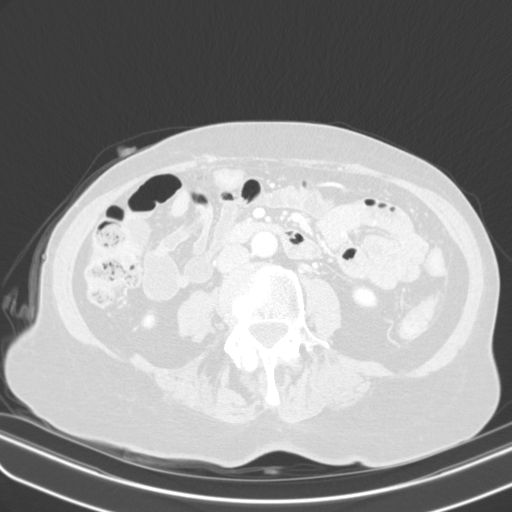
[im 25/124  bone]
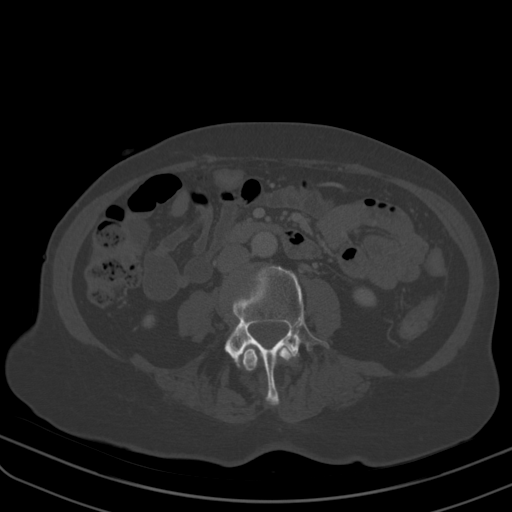
[im 50/124  soft-tissue]
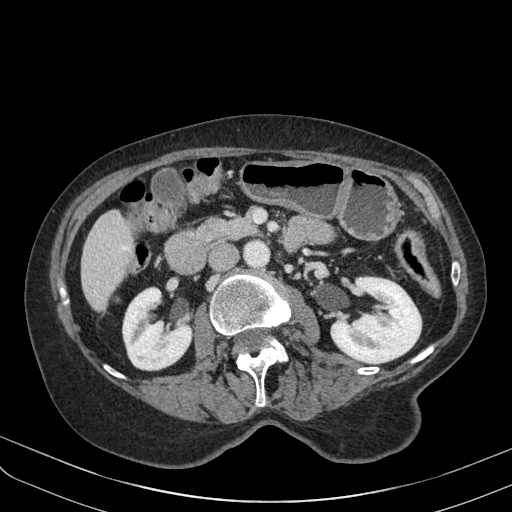
[im 50/124  lung]
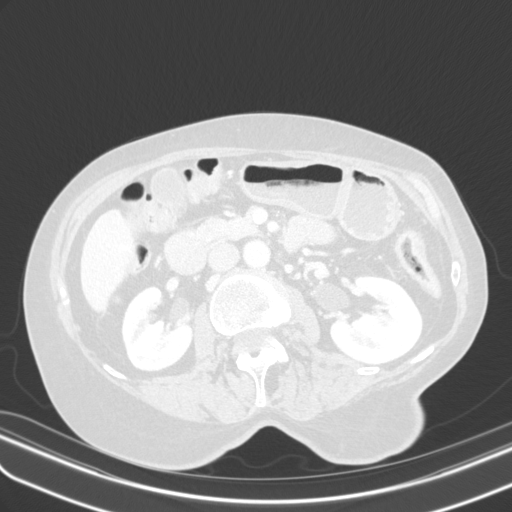
[im 74/124  soft-tissue]
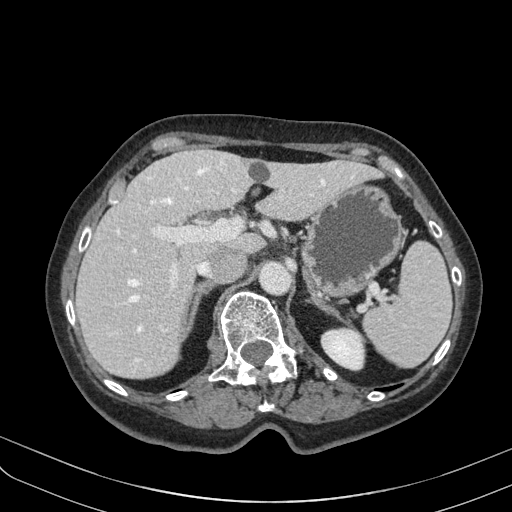
[im 74/124  lung]
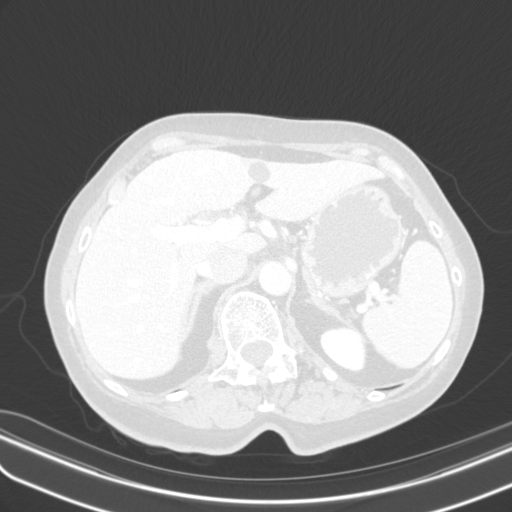
[im 99/124  soft-tissue]
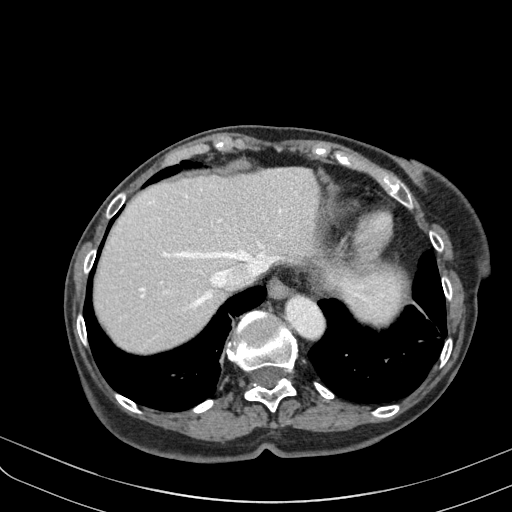
[im 99/124  lung]
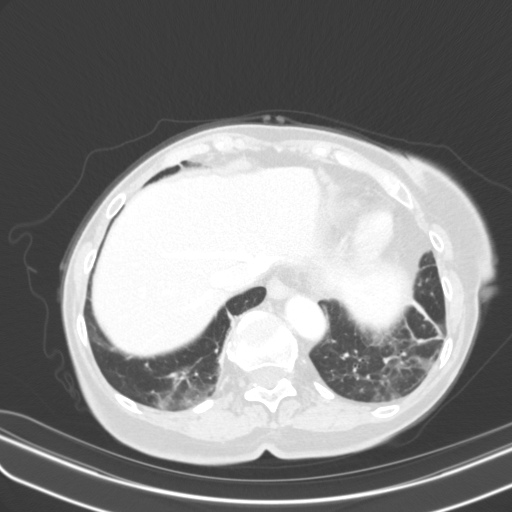

[Series 5: delay · axial · delayed · 0.69mm/px · z∈[-245,-97]mm · 4 of 124 slices shown]
[im 25/124  soft-tissue]
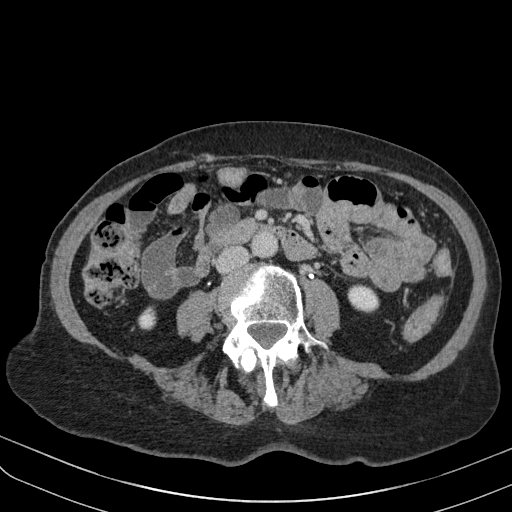
[im 50/124  soft-tissue]
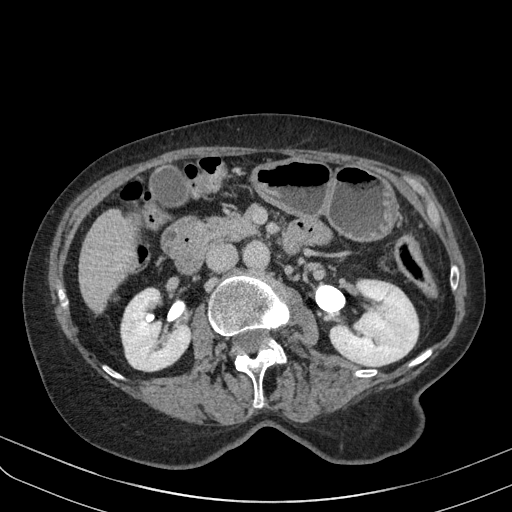
[im 74/124  soft-tissue]
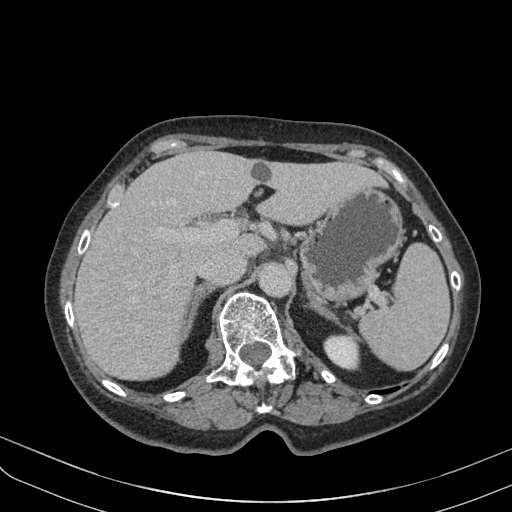
[im 99/124  soft-tissue]
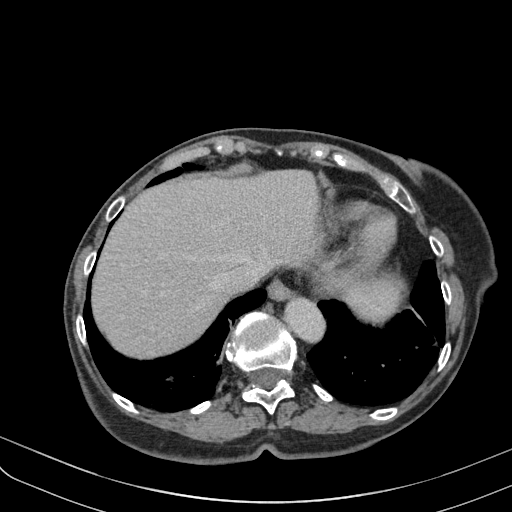

[12 of 32 positions shown; findings below may reference images not displayed]

RADIATION DOSE REDUCTION: This exam was performed according to the
departmental dose-optimization program which includes automated
exposure control, adjustment of the mA and/or kV according to
patient size and/or use of iterative reconstruction technique.

CONTRAST:  100mL DBQNIX-944 IOPAMIDOL (DBQNIX-944) INJECTION 61%
FINDINGS: Lower chest: No acute abnormality. Bandlike scarring and or
atelectasis of the bilateral lung bases.

Hepatobiliary: Benign, nonenhancing fluid attenuation cyst of the
left lobe of the liver, hepatic segment III, measuring 1.5 cm
(series 2, image 54). No solid liver abnormality is seen. No
gallstones, gallbladder wall thickening, or biliary dilatation.

Pancreas: Unremarkable. No pancreatic ductal dilatation or
surrounding inflammatory changes.

Spleen: Normal in size without significant abnormality.

Adrenals/Urinary Tract: Adrenal glands are unremarkable. Kidneys are
normal, without renal calculi, solid lesion, or hydronephrosis.
Bladder is unremarkable.

Stomach/Bowel: Stomach is within normal limits. Appendix appears
normal. No evidence of bowel wall thickening, distention, or
inflammatory changes.

Vascular/Lymphatic: No significant vascular findings are present. No
enlarged abdominal or pelvic lymph nodes.

Other: No abdominal wall hernia or abnormality. No ascites.

Musculoskeletal: No acute or significant osseous findings.
IMPRESSION: Benign, nonenhancing fluid attenuation cyst of the left lobe of the
liver, hepatic segment III, measuring 1.5 cm and corresponding to
findings of prior ultrasound, echogenicity reported by ultrasound
most likely due to minimally hemorrhagic or proteinaceous contents.
No further follow-up or characterization is required for this benign
cyst. No suspicious liver mass or contrast enhancement.

## 2022-07-11 ENCOUNTER — Telehealth: Payer: Self-pay | Admitting: Family Medicine

## 2022-07-11 NOTE — Telephone Encounter (Signed)
Copied from [place]. Topic: Medicare AWV >> Jul 11, 2022 12:35 PM Gillis Santa wrote: Reason for CRM: Called patient to schedule Medicare Annual Wellness Visit (AWV). Left message for patient to call back and schedule Medicare Annual Wellness Visit (AWV).  Last date of AWV: 07/13/2021  Please schedule an appointment at any time with Otila Kluver, [facility].  Please schedule AWVS with health coach Otila Kluver, [place].  If any questions, please contact me at [phone].  Thank you ,  Shaune Pollack [facility] AWV TEAM Direct Dial [phone]

## 2022-07-15 ENCOUNTER — Ambulatory Visit (INDEPENDENT_AMBULATORY_CARE_PROVIDER_SITE_OTHER): Payer: Medicare HMO | Admitting: Family Medicine

## 2022-07-15 ENCOUNTER — Encounter: Payer: Self-pay | Admitting: Family Medicine

## 2022-07-15 VITALS — BP 122/60 | HR 59 | Temp 97.7°F | Ht 69.0 in | Wt 157.6 lb

## 2022-07-15 DIAGNOSIS — B001 Herpesviral vesicular dermatitis: Secondary | ICD-10-CM | POA: Diagnosis not present

## 2022-07-15 MED ORDER — VALACYCLOVIR HCL 500 MG PO TABS: 500.0000 mg | ORAL_TABLET | Freq: Two times a day (BID) | ORAL | 0 refills | Status: DC

## 2022-07-15 NOTE — Progress Notes (Signed)
Subjective  CC:  Chief Complaint  Patient presents with   bump on Rt hand    Pt stated that she has a herps bump on her Rt hand and it hurts    HPI: Joyce Hatfield is a 77 y.o. female who presents to the office today to address the problems listed above in the chief complaint. Has h/o recurrent HSV dermatitis; outbreak yesterday on right hand/palm. Last was decades ago. Started with itching, now blistering and sore. No systemic sxs.    Assessment  1. Herpesviral vesicular dermatitis      Plan  Hsv dermatitis:  valtrex 500 bid x 7 days. Advil prn. Should do well.  Follow up: prn  Visit date not found  No orders of the defined types were placed in this encounter.  Meds ordered this encounter  Medications   valACYclovir (VALTREX) 500 MG tablet    Sig: Take 1 tablet (500 mg total) by mouth 2 (two) times daily.    Dispense:  14 tablet    Refill:  0      I reviewed the patients updated PMH, FH, and SocHx.    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[place]) 10/04/2021    Priority: High   Controlled type 2 diabetes mellitus without complication, without long-term current use of insulin ([place]) 06/26/2020    Priority: High   Hx of melanoma excision 11/13/2018    Priority: High   OSA on CPAP 09/08/2014    Priority: High   Hypertension associated with diabetes ([place]) 03/16/2014    Priority: High   Serrated adenoma of colon 11/08/2013    Priority: High   History of obstructive sleep apnea 07/01/2022    Priority: Medium    Hepatic steatosis 10/04/2021    Priority: Medium    Restless legs 11/12/2018    Priority: Medium    Osteoporosis, postmenopausal 05/05/2018    Priority: Medium    Liver cyst 09/17/2021    Priority: Low   Gilbert disease 01/04/2021    Priority: Low   Chronic allergic rhinitis 11/12/2018    Priority: Low   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   Cholecalciferol (VITAMIN D) 1000 UNITS capsule Take 1,000 Units by mouth daily.   Continuous Blood Gluc Receiver (FREESTYLE LIBRE 14 DAY READER) DEVI Use as directed   Continuous Blood Gluc Sensor (FREESTYLE LIBRE 3 SENSOR) MISC APPLY EVERY 14 DAYS   ferrous sulfate 325 (65 FE) MG tablet Take by mouth.   fexofenadine (ALLEGRA) 180 MG tablet Take by mouth.   hydrochlorothiazide (HYDRODIURIL) 25 MG tablet Take 1 tablet (25 mg total) by mouth daily.   lisinopril (ZESTRIL) 10 MG tablet TAKE 1 TABLET BY MOUTH EVERY DAY   Multiple Vitamin (MULTIVITAMIN) capsule Take 1 capsule by mouth daily.   rosuvastatin (CRESTOR) 5 MG tablet Take 1 tablet (5 mg total) by mouth at bedtime.   valACYclovir (VALTREX) 500 MG tablet Take 1 tablet (500 mg total) by mouth 2 (two) times daily.    Allergies: Patient is allergic to bee venom. Family History: Patient family history includes Breast cancer in her paternal aunt; Cancer in her mother; Diabetes in her brother; Early death in her paternal grandmother; Heart disease in her father and  paternal grandfather; Hypertension in her father and mother. Social History:  Patient  reports that she has never smoked. She has never used smokeless tobacco. She reports that she does not drink alcohol and does not use drugs.  Review of Systems: Constitutional: Negative for fever malaise or anorexia Cardiovascular: negative for chest pain Respiratory: negative for SOB or persistent cough Gastrointestinal: negative for abdominal pain  Objective  Vitals: BP 122/60   Pulse (!) 59   Temp 97.7 F (36.5 C)   Ht 5\' 9"  (1.753 m)   Wt 157 lb 9.6 oz (71.5 kg)   SpO2 98%   BMI 23.27 kg/m  General: no acute distress , A&Ox3 Skin:  Warm, 2nd MCP on right with vesicular rash on erythematous base  Commons side effects,  risks, benefits, and alternatives for medications and treatment plan prescribed today were discussed, and the patient expressed understanding of the given instructions. Patient is instructed to call or message via MyChart if he/she has any questions or concerns regarding our treatment plan. No barriers to understanding were identified. We discussed Red Flag symptoms and signs in detail. Patient expressed understanding regarding what to do in case of urgent or emergency type symptoms.  Medication list was reconciled, printed and provided to the patient in AVS. Patient instructions and summary information was reviewed with the patient as documented in the AVS. This note was prepared with assistance of Dragon voice recognition software. Occasional wrong-word or sound-a-like substitutions may have occurred due to the inherent limitations of voice recognition software

## 2022-08-12 DIAGNOSIS — R69 Illness, unspecified: Secondary | ICD-10-CM | POA: Diagnosis not present

## 2022-08-13 ENCOUNTER — Telehealth: Payer: Self-pay | Admitting: Family Medicine

## 2022-08-13 NOTE — Telephone Encounter (Signed)
Copied from CRM [id number]. Topic: Medicare AWV >> Aug 13, 2022 11:11 AM Gwenith Spitz wrote: Reason for CRM: Called patient to schedule Medicare Annual Wellness Visit (AWV). Left message for patient to call back and schedule Medicare Annual Wellness Visit (AWV).  Last date of AWV: 07/13/2021  Please schedule an appointment at any time with Inetta Fermo, [facility]. Please schedule AWVS with Inetta Fermo, [facility].  If any questions, please contact me at [phone].  Thank you ,  Gabriel Cirri [facility] AWV TEAM Direct Dial [phone]

## 2022-08-14 ENCOUNTER — Telehealth: Payer: Self-pay | Admitting: Family Medicine

## 2022-08-14 NOTE — Telephone Encounter (Signed)
Contacted Thompson Caul to schedule their annual wellness visit. Appointment made for 08/27/2022.  Gabriel Cirri [facility] AWV TEAM Direct Dial [phone]

## 2022-08-27 ENCOUNTER — Ambulatory Visit (INDEPENDENT_AMBULATORY_CARE_PROVIDER_SITE_OTHER): Payer: Medicare HMO

## 2022-08-27 VITALS — Wt 157.0 lb

## 2022-08-27 DIAGNOSIS — Z0000 Encounter for general adult medical examination without abnormal findings: Secondary | ICD-10-CM | POA: Diagnosis not present

## 2022-08-27 NOTE — Patient Instructions (Signed)
Joyce Hatfield , Thank you for taking time to come for your Medicare Wellness Visit. I appreciate your ongoing commitment to your health goals. Please review the following plan we discussed and let me know if I can assist you in the future.   These are the goals we discussed:  Goals      Patient Stated     Get blood Sugar under control      Patient Stated     Keep glucose down         This is a list of the screening recommended for you and due dates:  Health Maintenance  Topic Date Due   Flu Shot  11/28/2022   Hemoglobin A1C  01/01/2023   Yearly kidney function blood test for diabetes  01/05/2023   Complete foot exam   01/05/2023   Eye exam for diabetics  03/07/2023   Mammogram  05/30/2023   Yearly kidney health urinalysis for diabetes  07/01/2023   Medicare Annual Wellness Visit  08/27/2023   DEXA scan (bone density measurement)  11/10/2023   Colon Cancer Screening  01/29/2024   DTaP/Tdap/Td vaccine (3 - Td or Tdap) 04/05/2027   Pneumonia Vaccine  Completed   COVID-19 Vaccine  Completed   Hepatitis C Screening: USPSTF Recommendation to screen - Ages 25-79 yo.  Completed   Zoster (Shingles) Vaccine  Completed   HPV Vaccine  Aged Out    Advanced directives: Advance directive discussed with you today. Even though you declined this today please call our office should you change your mind and we can give you the proper paperwork for you to fill out.  Conditions/risks identified: get glucose down   Next appointment: Follow up in one year for your annual wellness visit    Preventive Care 65 Years and Older, Female Preventive care refers to lifestyle choices and visits with your health care provider that can promote health and wellness. What does preventive care include? A yearly physical exam. This is also called an annual well check. Dental exams once or twice a year. Routine eye exams. Ask your health care provider how often you should have your eyes checked. Personal  lifestyle choices, including: Daily care of your teeth and gums. Regular physical activity. Eating a healthy diet. Avoiding tobacco and drug use. Limiting alcohol use. Practicing safe sex. Taking low-dose aspirin every day. Taking vitamin and mineral supplements as recommended by your health care provider. What happens during an annual well check? The services and screenings done by your health care provider during your annual well check will depend on your age, overall health, lifestyle risk factors, and family history of disease. Counseling  Your health care provider may ask you questions about your: Alcohol use. Tobacco use. Drug use. Emotional well-being. Home and relationship well-being. Sexual activity. Eating habits. History of falls. Memory and ability to understand (cognition). Work and work Astronomer. Reproductive health. Screening  You may have the following tests or measurements: Height, weight, and BMI. Blood pressure. Lipid and cholesterol levels. These may be checked every 5 years, or more frequently if you are over 51 years old. Skin check. Lung cancer screening. You may have this screening every year starting at age 22 if you have a 30-pack-year history of smoking and currently smoke or have quit within the past 15 years. Fecal occult blood test (FOBT) of the stool. You may have this test every year starting at age 23. Flexible sigmoidoscopy or colonoscopy. You may have a sigmoidoscopy every 5 years or a  colonoscopy every 10 years starting at age 42. Hepatitis C blood test. Hepatitis B blood test. Sexually transmitted disease (STD) testing. Diabetes screening. This is done by checking your blood sugar (glucose) after you have not eaten for a while (fasting). You may have this done every 1-3 years. Bone density scan. This is done to screen for osteoporosis. You may have this done starting at age 58. Mammogram. This may be done every 1-2 years. Talk to your  health care provider about how often you should have regular mammograms. Talk with your health care provider about your test results, treatment options, and if necessary, the need for more tests. Vaccines  Your health care provider may recommend certain vaccines, such as: Influenza vaccine. This is recommended every year. Tetanus, diphtheria, and acellular pertussis (Tdap, Td) vaccine. You may need a Td booster every 10 years. Zoster vaccine. You may need this after age 37. Pneumococcal 13-valent conjugate (PCV13) vaccine. One dose is recommended after age 63. Pneumococcal polysaccharide (PPSV23) vaccine. One dose is recommended after age 44. Talk to your health care provider about which screenings and vaccines you need and how often you need them. This information is not intended to replace advice given to you by your health care provider. Make sure you discuss any questions you have with your health care provider. Document Released: 05/12/2015 Document Revised: 01/03/2016 Document Reviewed: 02/14/2015 Elsevier Interactive Patient Education  2017 [employer] Prevention in the Home Falls can cause injuries. They can happen to people of all ages. There are many things you can do to make your home safe and to help prevent falls. What can I do on the outside of my home? Regularly fix the edges of walkways and driveways and fix any cracks. Remove anything that might make you trip as you walk through a door, such as a raised step or threshold. Trim any bushes or trees on the path to your home. Use bright outdoor lighting. Clear any walking paths of anything that might make someone trip, such as rocks or tools. Regularly check to see if handrails are loose or broken. Make sure that both sides of any steps have handrails. Any raised decks and porches should have guardrails on the edges. Have any leaves, snow, or ice cleared regularly. Use sand or salt on walking paths during winter. Clean  up any spills in your garage right away. This includes oil or grease spills. What can I do in the bathroom? Use night lights. Install grab bars by the toilet and in the tub and shower. Do not use towel bars as grab bars. Use non-skid mats or decals in the tub or shower. If you need to sit down in the shower, use a plastic, non-slip stool. Keep the floor dry. Clean up any water that spills on the floor as soon as it happens. Remove soap buildup in the tub or shower regularly. Attach bath mats securely with double-sided non-slip rug tape. Do not have throw rugs and other things on the floor that can make you trip. What can I do in the bedroom? Use night lights. Make sure that you have a light by your bed that is easy to reach. Do not use any sheets or blankets that are too big for your bed. They should not hang down onto the floor. Have a firm chair that has side arms. You can use this for support while you get dressed. Do not have throw rugs and other things on the floor that can  make you trip. What can I do in the kitchen? Clean up any spills right away. Avoid walking on wet floors. Keep items that you use a lot in easy-to-reach places. If you need to reach something above you, use a strong step stool that has a grab bar. Keep electrical cords out of the way. Do not use floor polish or wax that makes floors slippery. If you must use wax, use non-skid floor wax. Do not have throw rugs and other things on the floor that can make you trip. What can I do with my stairs? Do not leave any items on the stairs. Make sure that there are handrails on both sides of the stairs and use them. Fix handrails that are broken or loose. Make sure that handrails are as long as the stairways. Check any carpeting to make sure that it is firmly attached to the stairs. Fix any carpet that is loose or worn. Avoid having throw rugs at the top or bottom of the stairs. If you do have throw rugs, attach them to the  floor with carpet tape. Make sure that you have a light switch at the top of the stairs and the bottom of the stairs. If you do not have them, ask someone to add them for you. What else can I do to help prevent falls? Wear shoes that: Do not have high heels. Have rubber bottoms. Are comfortable and fit you well. Are closed at the toe. Do not wear sandals. If you use a stepladder: Make sure that it is fully opened. Do not climb a closed stepladder. Make sure that both sides of the stepladder are locked into place. Ask someone to hold it for you, if possible. Clearly mark and make sure that you can see: Any grab bars or handrails. First and last steps. Where the edge of each step is. Use tools that help you move around (mobility aids) if they are needed. These include: Canes. Walkers. Scooters. Crutches. Turn on the lights when you go into a dark area. Replace any light bulbs as soon as they burn out. Set up your furniture so you have a clear path. Avoid moving your furniture around. If any of your floors are uneven, fix them. If there are any pets around you, be aware of where they are. Review your medicines with your doctor. Some medicines can make you feel dizzy. This can increase your chance of falling. Ask your doctor what other things that you can do to help prevent falls. This information is not intended to replace advice given to you by your health care provider. Make sure you discuss any questions you have with your health care provider. Document Released: 02/09/2009 Document Revised: 09/21/2015 Document Reviewed: 05/20/2014 Elsevier Interactive Patient Education  2017 [employer].

## 2022-08-27 NOTE — Progress Notes (Signed)
I connected with  Joyce Hatfield on 08/27/22 by a audio enabled telemedicine application and verified that I am speaking with the correct person using two identifiers.  Patient Location: Home  Provider Location: Office/Clinic  I discussed the limitations of evaluation and management by telemedicine. The patient expressed understanding and agreed to proceed.   Patient Medicare AWV questionnaire was completed by the patient on 08/26/22; I have confirmed that all information answered by patient is correct and no changes since this date.      Subjective:   Joyce Hatfield is a 77 y.o. female who presents for Medicare Annual (Subsequent) preventive examination.  Review of Systems     Cardiac Risk Factors include: advanced age (>3men, >75 women);diabetes mellitus;dyslipidemia;hypertension     Objective:    Today's Vitals   08/27/22 1005  Weight: 157 lb (71.2 kg)   Body mass index is 23.18 kg/m.     08/27/2022   10:10 AM 07/13/2021    8:57 AM 07/19/2020    2:01 PM  Advanced Directives  Does Patient Have a Medical Advance Directive? No No No  Would patient like information on creating a medical advance directive? No - Patient declined Yes (MAU/Ambulatory/Procedural Areas - Information given) No - Patient declined    Current Medications (verified) Outpatient Encounter Medications as of 08/27/2022  Medication Sig   Calcium Carbonate-Vitamin D 600-400 MG-UNIT tablet Take 1 tablet by mouth daily.   Cholecalciferol (VITAMIN D) 1000 UNITS capsule Take 1,000 Units by mouth daily.   Continuous Blood Gluc Receiver (FREESTYLE LIBRE 14 DAY READER) DEVI Use as directed   Continuous Blood Gluc Sensor (FREESTYLE LIBRE 3 SENSOR) MISC APPLY EVERY 14 DAYS   ferrous sulfate 325 (65 FE) MG tablet Take by mouth.   fexofenadine (ALLEGRA) 180 MG tablet Take by mouth.   FLUAD QUADRIVALENT 0.5 ML injection    hydrochlorothiazide (HYDRODIURIL) 25 MG tablet Take 1 tablet (25 mg total) by mouth daily.    lisinopril (ZESTRIL) 10 MG tablet TAKE 1 TABLET BY MOUTH EVERY DAY   Multiple Vitamin (MULTIVITAMIN) capsule Take 1 capsule by mouth daily.   Omega-3 Fatty Acids (FISH OIL BURP-LESS) 1000 MG CAPS    rosuvastatin (CRESTOR) 5 MG tablet Take 1 tablet (5 mg total) by mouth at bedtime.   SPIKEVAX injection    Turmeric 500 MG CAPS    valACYclovir (VALTREX) 500 MG tablet Take 1 tablet (500 mg total) by mouth 2 (two) times daily.   vitamin k 100 MCG tablet    No facility-administered encounter medications on file as of 08/27/2022.    Allergies (verified) Bee venom   History: Past Medical History:  Diagnosis Date   Cancer (HCC) 2008   melanoma   Diabetes mellitus without complication (HCC)    Hepatic steatosis 10/04/2021   Ultrasound 2022   Hyperlipidemia    Hypertension    Osteoporosis    Sleep apnea    using CPAP since 2016   Past Surgical History:  Procedure Laterality Date   BIRTH MARK REMOVED  1959   BREAST BIOPSY Right 2010   benign   BROKEN HIP  1995   ENDOMETRIOUSIS LAPAROSCOPY  1985   MELANOMA REMOVED  2008   Family History  Problem Relation Age of Onset   Cancer Mother    Hypertension Mother    Heart disease Father    Hypertension Father    Diabetes Brother    Breast cancer Paternal Aunt    Early death Paternal Grandmother    Heart  disease Paternal Grandfather    Social History   Socioeconomic History   Marital status: Married    Spouse name: Not on file   Number of children: 0   Years of education: Not on file   Highest education level: Not on file  Occupational History   Not on file  Tobacco Use   Smoking status: Never   Smokeless tobacco: Never  Vaping Use   Vaping Use: Never used  Substance and Sexual Activity   Alcohol use: No    Comment: OCCASIONALLY   Drug use: Never   Sexual activity: Not on file  Other Topics Concern   Not on file  Social History Narrative   Not on file   Social Determinants of Health   Financial Resource Strain:  Low Risk  (08/26/2022)   Overall Financial Resource Strain (CARDIA)    Difficulty of Paying Living Expenses: Not hard at all  Food Insecurity: No Food Insecurity (08/26/2022)   Hunger Vital Sign    Worried About Running Out of Food in the Last Year: Never true    Ran Out of Food in the Last Year: Never true  Transportation Needs: No Transportation Needs (08/26/2022)   PRAPARE - Administrator, Civil Service (Medical): No    Lack of Transportation (Non-Medical): No  Physical Activity: Sufficiently Active (08/26/2022)   Exercise Vital Sign    Days of Exercise per Week: 7 days    Minutes of Exercise per Session: 60 min  Stress: No Stress Concern Present (08/26/2022)   [employer] of Occupational Health - Occupational Stress Questionnaire    Feeling of Stress : Not at all  Social Connections: Moderately Integrated (08/26/2022)   Social Connection and Isolation Panel [NHANES]    Frequency of Communication with Friends and Family: Once a week    Frequency of Social Gatherings with Friends and Family: Three times a week    Attends Religious Services: Never    Active Member of Clubs or Organizations: Yes    Attends Engineer, structural: More than 4 times per year    Marital Status: Married    Tobacco Counseling Counseling given: Not Answered   Clinical Intake:  Pre-visit preparation completed: Yes  Pain : No/denies pain     BMI - recorded: 23.18 Nutritional Status: BMI of 19-24  Normal Nutritional Risks: None Diabetes: Yes CBG done?: Yes (pt stated 123) CBG resulted in Enter/ Edit results?: No Did pt. bring in CBG monitor from home?: No  How often do you need to have someone help you when you read instructions, pamphlets, or other written materials from your doctor or pharmacy?: 1 - Never  Diabetic?Nutrition Risk Assessment:  Has the patient had any N/V/D within the last 2 months?  No  Does the patient have any non-healing wounds?  No  Has the patient  had any unintentional weight loss or weight gain?  No   Diabetes:  Is the patient diabetic?  Yes  If diabetic, was a CBG obtained today?  Yes  Did the patient bring in their glucometer from home?  No  How often do you monitor your CBG's? daily.   Financial Strains and Diabetes Management:  Are you having any financial strains with the device, your supplies or your medication? No .  Does the patient want to be seen by Chronic Care Management for management of their diabetes?  No  Would the patient like to be referred to a Nutritionist or for Diabetic Management?  No  Diabetic Exams:  Diabetic Eye Exam: Completed 03/06/22 Diabetic Foot Exam: Completed 01/04/22   Interpreter Needed?: No  Information entered by :: Lanier Ensign, LPN   Activities of Daily Living    08/26/2022   10:46 AM  In your present state of health, do you have any difficulty performing the following activities:  Hearing? 0  Vision? 0  Difficulty concentrating or making decisions? 0  Walking or climbing stairs? 0  Dressing or bathing? 0  Doing errands, shopping? 0  Preparing Food and eating ? N  Using the Toilet? N  In the past six months, have you accidently leaked urine? N  Do you have problems with loss of bowel control? N  Managing your Medications? N  Managing your Finances? N  Housekeeping or managing your Housekeeping? N    Patient Care Team: Willow Ora, MD as PCP - General (Family Medicine)  Indicate any recent Medical Services you may have received from other than [facility] providers in the past year (date may be approximate).     Assessment:   This is a routine wellness examination for [facility].  Hearing/Vision screen Hearing Screening - Comments:: Pt denies any hearing issues  Vision Screening - Comments:: Pt follows up with Dr Manning Charity for annual eye exams   Dietary issues and exercise activities discussed: Current Exercise Habits: Home exercise routine, Type of exercise: Other  - see comments, Time (Minutes): 60, Frequency (Times/Week): 7, Weekly Exercise (Minutes/Week): 420   Goals Addressed             This Visit's Progress    Patient Stated       Keep glucose down        Depression Screen    08/27/2022   10:11 AM 07/15/2022   10:49 AM 07/01/2022    8:57 AM 01/04/2022   10:45 AM 07/13/2021    8:56 AM 01/03/2021    8:51 AM 07/19/2020    2:01 PM  PHQ 2/9 Scores  PHQ - 2 Score 0 0 0 0 0 0 0    Fall Risk    08/26/2022   10:46 AM 07/15/2022   10:49 AM 07/01/2022    8:57 AM 01/04/2022   10:45 AM 10/04/2021   10:22 AM  Fall Risk   Falls in the past year? 0 0 0 0 0  Number falls in past yr:  0 0 0 0  Injury with Fall?  0 0 0 0  Risk for fall due to : Impaired vision No Fall Risks No Fall Risks No Fall Risks No Fall Risks  Follow up Falls prevention discussed Falls evaluation completed Falls evaluation completed Falls evaluation completed Falls evaluation completed    FALL RISK PREVENTION PERTAINING TO THE HOME:  Any stairs in or around the home? Yes  If so, are there any without handrails? No  Home free of loose throw rugs in walkways, pet beds, electrical cords, etc? Yes  Adequate lighting in your home to reduce risk of falls? Yes   ASSISTIVE DEVICES UTILIZED TO PREVENT FALLS:  Life alert? No  Use of a cane, walker or w/c? No  Grab bars in the bathroom? No  Shower chair or bench in shower? No  Elevated toilet seat or a handicapped toilet? No   TIMED UP AND GO:  Was the test performed? No .   Cognitive Function:        08/27/2022   10:13 AM 07/13/2021    9:00 AM  6CIT Screen  What Year?  0 points 0 points  What month? 0 points 0 points  What time? 0 points 0 points  Count back from 20 0 points 0 points  Months in reverse 0 points 0 points  Repeat phrase 0 points 0 points  Total Score 0 points 0 points    Immunizations Immunization History  Administered Date(s) Administered   Covid-19, Mrna,Vaccine(Spikevax)57yrs and older 07/24/2022    Fluad Quad(high Dose 65+) 03/01/2021, 03/06/2022   Influenza Inj Mdck Quad Pf 05/05/2018   Influenza, High Dose Seasonal PF 02/16/2016, 02/05/2017, 02/03/2019   Influenza-Unspecified 02/24/2020   PFIZER Comirnaty(Gray Top)Covid-19 Tri-Sucrose Vaccine 01/25/2022   PFIZER(Purple Top)SARS-COV-2 Vaccination 06/05/2019, 06/30/2019, 02/04/2020, 09/05/2020   Pfizer Covid-19 Vaccine Bivalent Booster 25yrs & up 01/15/2021   Pneumococcal Conjugate-13 06/15/2013   Pneumococcal Polysaccharide-23 09/28/2015   Respiratory Syncytial Virus Vaccine,Recomb Aduvanted(Arexvy) 02/15/2022   Tdap 10/23/2006, 04/04/2017   Zoster Recombinat (Shingrix) 10/09/2016, 12/22/2016   Zoster, Live 04/30/2007, 03/05/2010    TDAP status: Due, Education has been provided regarding the importance of this vaccine. Advised may receive this vaccine at local pharmacy or Health Dept. Aware to provide a copy of the vaccination record if obtained from local pharmacy or Health Dept. Verbalized acceptance and understanding.  Flu Vaccine status: Up to date  Pneumococcal vaccine status: Up to date  Covid-19 vaccine status: Completed vaccines  Qualifies for Shingles Vaccine? Yes   Zostavax completed Yes   Shingrix Completed?: Yes  Screening Tests Health Maintenance  Topic Date Due   INFLUENZA VACCINE  11/28/2022   HEMOGLOBIN A1C  01/01/2023   Diabetic kidney evaluation - eGFR measurement  01/05/2023   FOOT EXAM  01/05/2023   OPHTHALMOLOGY EXAM  03/07/2023   MAMMOGRAM  05/30/2023   Diabetic kidney evaluation - Urine ACR  07/01/2023   Medicare Annual Wellness (AWV)  08/27/2023   DEXA SCAN  11/10/2023   COLONOSCOPY (Pts 45-68yrs Insurance coverage will need to be confirmed)  01/29/2024   DTaP/Tdap/Td (3 - Td or Tdap) 04/05/2027   Pneumonia Vaccine 33+ Years old  Completed   COVID-19 Vaccine  Completed   Hepatitis C Screening  Completed   Zoster Vaccines- Shingrix  Completed   HPV VACCINES  Aged Out    Health  Maintenance  There are no preventive care reminders to display for this patient.   Colorectal cancer screening: Type of screening: Colonoscopy. Completed 01/29/19. Repeat every 5 years  Mammogram status: Completed 05/29/22. Repeat every year  Bone Density status: Completed 11/09/20. Results reflect: Bone density results: OSTEOPOROSIS. Repeat every 2 years.   Additional Screening:  Hepatitis C Screening:  Completed 06/14/21  Vision Screening: Recommended annual ophthalmology exams for early detection of glaucoma and other disorders of the eye. Is the patient up to date with their annual eye exam?  Yes  Who is the provider or what is the name of the office in which the patient attends annual eye exams? Dr Manning Charity  If pt is not established with a provider, would they like to be referred to a provider to establish care? No .   Dental Screening: Recommended annual dental exams for proper oral hygiene  Community Resource Referral / Chronic Care Management: CRR required this visit?  No   CCM required this visit?  No      Plan:     I have personally reviewed and noted the following in the patient's chart:   Medical and social history Use of alcohol, tobacco or illicit drugs  Current medications and supplements including opioid prescriptions. Patient  is not currently taking opioid prescriptions. Functional ability and status Nutritional status Physical activity Advanced directives List of other physicians Hospitalizations, surgeries, and ER visits in previous 12 months Vitals Screenings to include cognitive, depression, and falls Referrals and appointments  In addition, I have reviewed and discussed with patient certain preventive protocols, quality metrics, and best practice recommendations. A written personalized care plan for preventive services as well as general preventive health recommendations were provided to patient.     Marzella Schlein, LPN   [id number]   Nurse  Notes: none

## 2022-09-11 DIAGNOSIS — R69 Illness, unspecified: Secondary | ICD-10-CM | POA: Diagnosis not present

## 2022-10-11 DIAGNOSIS — R69 Illness, unspecified: Secondary | ICD-10-CM | POA: Diagnosis not present

## 2022-12-06 DIAGNOSIS — R69 Illness, unspecified: Secondary | ICD-10-CM | POA: Diagnosis not present

## 2022-12-12 ENCOUNTER — Encounter (INDEPENDENT_AMBULATORY_CARE_PROVIDER_SITE_OTHER): Payer: Self-pay

## 2022-12-14 ENCOUNTER — Other Ambulatory Visit: Payer: Self-pay | Admitting: Family Medicine

## 2022-12-14 DIAGNOSIS — I10 Essential (primary) hypertension: Secondary | ICD-10-CM

## 2023-01-22 ENCOUNTER — Ambulatory Visit: Payer: Medicare HMO | Admitting: Family Medicine

## 2023-01-22 ENCOUNTER — Encounter: Payer: Self-pay | Admitting: Family Medicine

## 2023-01-22 VITALS — BP 124/74 | HR 70 | Temp 97.2°F | Ht 69.0 in | Wt 160.2 lb

## 2023-01-22 DIAGNOSIS — K760 Fatty (change of) liver, not elsewhere classified: Secondary | ICD-10-CM

## 2023-01-22 DIAGNOSIS — E782 Mixed hyperlipidemia: Secondary | ICD-10-CM | POA: Diagnosis not present

## 2023-01-22 DIAGNOSIS — G2581 Restless legs syndrome: Secondary | ICD-10-CM

## 2023-01-22 DIAGNOSIS — I7789 Other specified disorders of arteries and arterioles: Secondary | ICD-10-CM | POA: Diagnosis not present

## 2023-01-22 DIAGNOSIS — I152 Hypertension secondary to endocrine disorders: Secondary | ICD-10-CM

## 2023-01-22 DIAGNOSIS — Z780 Asymptomatic menopausal state: Secondary | ICD-10-CM

## 2023-01-22 DIAGNOSIS — Z0001 Encounter for general adult medical examination with abnormal findings: Secondary | ICD-10-CM

## 2023-01-22 DIAGNOSIS — M810 Age-related osteoporosis without current pathological fracture: Secondary | ICD-10-CM

## 2023-01-22 DIAGNOSIS — E119 Type 2 diabetes mellitus without complications: Secondary | ICD-10-CM

## 2023-01-22 DIAGNOSIS — E1169 Type 2 diabetes mellitus with other specified complication: Secondary | ICD-10-CM

## 2023-01-22 DIAGNOSIS — E1159 Type 2 diabetes mellitus with other circulatory complications: Secondary | ICD-10-CM | POA: Diagnosis not present

## 2023-01-22 LAB — COMPREHENSIVE METABOLIC PANEL
ALT: 12 U/L (ref 0–35)
AST: 17 U/L (ref 0–37)
Albumin: 4.2 g/dL (ref 3.5–5.2)
Alkaline Phosphatase: 83 U/L (ref 39–117)
BUN: 28 mg/dL — ABNORMAL HIGH (ref 6–23)
CO2: 31 mEq/L (ref 19–32)
Calcium: 9.8 mg/dL (ref 8.4–10.5)
Chloride: 101 mEq/L (ref 96–112)
Creatinine, Ser: 0.81 mg/dL (ref 0.40–1.20)
GFR: 71.33 mL/min (ref >60.00)
Glucose, Bld: 162 mg/dL — ABNORMAL HIGH (ref 70–99)
Potassium: 3.8 mEq/L (ref 3.5–5.1)
Sodium: 139 mEq/L (ref 135–145)
Total Bilirubin: 1.4 mg/dL — ABNORMAL HIGH (ref 0.2–1.2)
Total Protein: 6.6 g/dL (ref 6.0–8.3)

## 2023-01-22 LAB — LIPID PANEL
Cholesterol: 183 mg/dL (ref 0–200)
HDL: 77.8 mg/dL (ref >39.00)
LDL Cholesterol: 92 mg/dL (ref 0–99)
NonHDL: 105.46
Total CHOL/HDL Ratio: 2
Triglycerides: 69 mg/dL (ref 0.0–149.0)
VLDL: 13.8 mg/dL (ref 0.0–40.0)

## 2023-01-22 LAB — CBC WITH DIFFERENTIAL/PLATELET
Basophils Absolute: 0 10*3/uL (ref 0.0–0.1)
Basophils Relative: 0.8 % (ref 0.0–3.0)
Eosinophils Absolute: 0.1 10*3/uL (ref 0.0–0.7)
Eosinophils Relative: 2.1 % (ref 0.0–5.0)
HCT: 46.2 % — ABNORMAL HIGH (ref 36.0–46.0)
Hemoglobin: 15 g/dL (ref 12.0–15.0)
Lymphocytes Relative: 25.6 % (ref 12.0–46.0)
Lymphs Abs: 1.4 10*3/uL (ref 0.7–4.0)
MCHC: 32.5 g/dL (ref 30.0–36.0)
MCV: 89 fl (ref 78.0–100.0)
Monocytes Absolute: 0.3 10*3/uL (ref 0.1–1.0)
Monocytes Relative: 4.8 % (ref 3.0–12.0)
Neutro Abs: 3.6 10*3/uL (ref 1.4–7.7)
Neutrophils Relative %: 66.7 % (ref 43.0–77.0)
Platelets: 211 10*3/uL (ref 150.0–400.0)
RBC: 5.2 Mil/uL — ABNORMAL HIGH (ref 3.87–5.11)
RDW: 13.7 % (ref 11.5–15.5)
WBC: 5.5 10*3/uL (ref 4.0–10.5)

## 2023-01-22 LAB — MICROALBUMIN / CREATININE URINE RATIO
Creatinine,U: 58 mg/dL
Microalb Creat Ratio: 3 mg/g (ref 0.0–30.0)
Microalb, Ur: 1.7 mg/dL (ref 0.0–1.9)

## 2023-01-22 LAB — POCT GLYCOSYLATED HEMOGLOBIN (HGB A1C): Hemoglobin A1C: 6.3 % — AB (ref 4.0–5.6)

## 2023-01-22 LAB — TSH: TSH: 1.36 u[IU]/mL (ref 0.35–5.50)

## 2023-01-22 NOTE — Progress Notes (Signed)
Subjective  Chief Complaint  Patient presents with   Annual Exam    Pt here for annual exam - fasting    Diabetes   Hypertension   Hyperlipidemia   Osteoporosis    HPI: Joyce Hatfield is a 77 y.o. female who presents to [facility] [place] today for a Female Wellness Visit. She also has the concerns and/or needs as listed above in the chief complaint. These will be addressed in addition to the Health Maintenance Visit.   Wellness Visit: annual visit with health maintenance review and exam  HM: Mammogram is current and due again next January.  Continues healthy diet, healthy lifestyle.  Keeps active.  No concerns.  Eye exam up-to-date.  No retinopathy.  Will get flu vaccine and October.  Defers today.  Has had COVID booster.  Other immunizations current. Chronic disease f/u and/or acute problem visit: (deemed necessary to be done in addition to the wellness visit): Tubular adenoma history with last colonoscopy 2020 in [place]. Needs repeat in 2025. Will need referral to [facility] next year.  Diet-controlled diabetes: Fastings have been mildly elevated.  She checks intermittently.  Diet is well-controlled without changes.  Remains active.  No symptoms of hyperglycemia.  No symptoms of peripheral neuropathy.  Weight is mostly stable, has gained 3 pounds. Hypertension is well-controlled.  No chest pain or shortness of breath.  Compliant with medications.  Checks home readings, averaging 120s over 70s.  Occasionally will be in low 80s. Hyperlipidemia on Crestor 5 mg nightly.  Tolerating well.  Fasting for recheck Osteoporosis: History of treatment with Fosamax.  Drug holiday started 2023.  Last bone density had shown improvement in 2022.  Due for recheck.  Very active.  Takes supplements. Restless leg syndrome: Does very well taking daily iron supplements.   Assessment  1. Encounter for well adult exam with abnormal findings   2. Controlled type 2 diabetes mellitus without  complication, without long-term current use of insulin (HCC)   3. Hypertension associated with diabetes (HCC)   4. Combined hyperlipidemia associated with type 2 diabetes mellitus (HCC)   5. 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 Asymptomatic menopausal state   8. Restless legs   9. Hepatic steatosis      Plan  Female Wellness Visit: Age appropriate Health Maintenance and Prevention measures were discussed with patient. Included topics are cancer screening recommendations, ways to keep healthy (see AVS) including dietary and exercise recommendations, regular eye and dental care, use of seat belts, and avoidance of moderate alcohol use and tobacco use.  Screens are current BMI: discussed patient's BMI and encouraged positive lifestyle modifications to help get to or maintain a target BMI. HM needs and immunizations were addressed and ordered. See below for orders. See HM and immunization section for updates.  To get flu shot from pharmacy next month Routine labs and screening tests ordered including cmp, cbc and lipids where appropriate. Discussed recommendations regarding Vit D and calcium supplementation (see AVS)  Chronic disease management visit and/or acute problem visit: Type 2 diabetes is diet controlled.  A1c is elevated from 6 months ago.  She will continue to monitor with home glucose monitoring intermittently.  Continue healthy diet and exercise.  Recheck in 6 months.  No indication for new medications at this time.  Check renal function electrolytes and urine nephropathy screening on ACE inhibitor. Hyperlipidemia on Crestor 5 mg nightly.  Check fasting lipids today with LFTs.  Osteoporosis: Recheck bone density, off Fosamax for about 1 year.  Continue vitamin D and calcium.  Continue weightbearing exercises. Restless legs are controlled on iron daily. Monitoring LFTs with hepatic steatosis on statin. Hypertension is well-controlled, taking  lisinopril 10 mg and HCTZ 25 daily.  Check renal function electrolytes.  Continue home monitoring.  Patient to let me know if diastolics run in the 80s. 6 Follow up: 6 months for recheck diabetes and blood pressure Orders Placed This Encounter  Procedures   DG Bone Density   CBC with Differential/Platelet   Comprehensive metabolic panel   Lipid panel   TSH   Microalbumin / creatinine urine ratio   POCT HgB A1C   No orders of the defined types were placed in this encounter.     Body mass index is 23.66 kg/m. Wt Readings from Last 3 Encounters:  01/22/23 160 lb 3.2 oz (72.7 kg)  08/27/22 157 lb (71.2 kg)  07/15/22 157 lb 9.6 oz (71.5 kg)     Patient Active Problem List   Diagnosis Date Noted Date Diagnosed   Combined hyperlipidemia associated with type 2 diabetes mellitus (HCC) 10/04/2021     Priority: High    Can't tolerate crestor 10; takes 5     Controlled type 2 diabetes mellitus without complication, without long-term current use of insulin (HCC) 06/26/2020     Priority: High   Hx of melanoma excision 11/13/2018     Priority: High   Hypertension associated with diabetes (HCC) 03/16/2014     Priority: High   Serrated adenoma of colon 11/08/2013     Priority: High    2015 sessile serrated adenoma size ? And tubular adenoma recheck 5 yr    History of obstructive sleep apnea 07/01/2022     Priority: Medium     Stopped cpap 2024 after 40 pound weight loss    Hepatic steatosis 10/04/2021     Priority: Medium     Ultrasound 2022    Restless legs 11/12/2018     Priority: Medium    Osteoporosis, postmenopausal 05/05/2018     Priority: Medium     Dexa has been sl osteopenic. Was treated with fosamax x 5 years, then drug holiday, then restarted 2018. Drug holiday starting 2023  10/2020: T = -2.4 femur. Osteopenia.  09/25/2016 FRAX 19/3.4 ; low impact distal radius fracture. 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  See records from [facility] Maintenance  Topic Date Due   Diabetic kidney evaluation - eGFR measurement  01/05/2023   INFLUENZA VACCINE  07/28/2023 (Originally 11/28/2022)   OPHTHALMOLOGY EXAM  03/07/2023   COVID-19 Vaccine (8 - 2023-24 season) 04/30/2023   MAMMOGRAM  05/30/2023   Diabetic kidney evaluation - Urine ACR  07/01/2023   HEMOGLOBIN A1C  07/22/2023   Medicare Annual Wellness (AWV)  08/27/2023   DEXA SCAN  11/10/2023  FOOT EXAM  01/22/2024   Colonoscopy  01/29/2024   DTaP/Tdap/Td (3 - Td or Tdap) 04/05/2027   Pneumonia Vaccine 24+ Years old  Completed   Hepatitis C Screening  Completed   Zoster Vaccines- Shingrix  Completed   HPV VACCINES  Aged Out   Immunization History  Administered Date(s) Administered   Fluad Quad(high Dose 65+) 03/01/2021, 03/06/2022   Influenza Inj Mdck Quad Pf 05/05/2018   Influenza, High Dose Seasonal PF 02/16/2016, 02/05/2017, 02/03/2019   Influenza-Unspecified 02/24/2020   Moderna Covid-19 Fall Seasonal Vaccine 83yrs & older 12/29/2022   PFIZER Comirnaty(Gray Top)Covid-19 Tri-Sucrose Vaccine 01/25/2022   PFIZER(Purple Top)SARS-COV-2 Vaccination 06/05/2019, 06/30/2019, 02/04/2020, 09/05/2020   Pfizer Covid-19 Vaccine Bivalent Booster 22yrs & up 01/15/2021   Pneumococcal Conjugate-13 06/15/2013   Pneumococcal Polysaccharide-23 09/28/2015   Respiratory Syncytial Virus Vaccine,Recomb Aduvanted(Arexvy) 02/15/2022   Tdap 10/23/2006, 04/04/2017   Zoster Recombinant(Shingrix) 10/09/2016, 12/22/2016    Zoster, Live 04/30/2007, 03/05/2010   We updated and reviewed the patient's past history in detail and it is documented below. Allergies: Patient is allergic to bee venom. Past Medical History Patient  has a past medical history of Allergy, Cancer (HCC) (2008), Diabetes mellitus without complication (HCC), Hepatic steatosis (10/04/2021), Hyperlipidemia, Hypertension, Osteoporosis, and Sleep apnea. Past Surgical History Patient  has a past surgical history that includes BIRTH MARK REMOVED (1959); BROKEN HIP (1995); MELANOMA REMOVED (2008); ENDOMETRIOUSIS LAPAROSCOPY (1985); Breast biopsy (Right, 2010); and Fracture surgery. Family History: Patient family history includes Breast cancer in her paternal aunt; Cancer in her mother; Diabetes in her brother; Early death in her paternal grandmother; Heart disease in her father and paternal grandfather; Hypertension in her father and mother. Social History:  Patient  reports that she has never smoked. She has never used smokeless tobacco. She reports that she does not drink alcohol and does not use drugs.  Review of Systems: Constitutional: negative for fever or malaise Ophthalmic: negative for photophobia, double vision or loss of vision Cardiovascular: negative for chest pain, dyspnea on exertion, or new LE swelling Respiratory: negative for SOB or persistent cough Gastrointestinal: negative for abdominal pain, change in bowel habits or melena Genitourinary: negative for dysuria or gross hematuria, no abnormal uterine bleeding or disharge Musculoskeletal: negative for new gait disturbance or muscular weakness Integumentary: negative for new or persistent rashes, no breast lumps Neurological: negative for TIA or stroke symptoms Psychiatric: negative for SI or delusions Allergic/Immunologic: negative for hives  Patient Care Team    Relationship Specialty Notifications Start End  Willow Ora, MD PCP - General Family Medicine  06/14/20      Objective  Vitals: BP 124/74   Pulse 70   Temp (!) 97.2 F (36.2 C)   Ht 5\' 9"  (1.753 m)   Wt 160 lb 3.2 oz (72.7 kg)   SpO2 98%   BMI 23.66 kg/m  General:  Well developed, well nourished, no acute distress  Psych:  Alert and orientedx3,normal mood and affect HEENT:  Normocephalic, atraumatic, non-icteric sclera,  supple neck without adenopathy, mass or thyromegaly Cardiovascular:  Normal S1, S2, RRR without gallop, rub or murmur Respiratory:  Good breath sounds bilaterally, CTAB with normal respiratory effort Gastrointestinal: normal bowel sounds, soft, non-tender, no noted masses. No HSM MSK: extremities without edema, joints without erythema or swelling Neurologic:    Mental status is normal.  Gross motor and sensory exams are normal.  No tremor  Commons side effects, risks, benefits, and alternatives for medications and treatment plan prescribed today were discussed, and the patient  expressed understanding of the given instructions. Patient is instructed to call or message via MyChart if he/she has any questions or concerns regarding our treatment plan. No barriers to understanding were identified. We discussed Red Flag symptoms and signs in detail. Patient expressed understanding regarding what to do in case of urgent or emergency type symptoms.  Medication list was reconciled, printed and provided to the patient in AVS. Patient instructions and summary information was reviewed with the patient as documented in the AVS. This note was prepared with assistance of Dragon voice recognition software. Occasional wrong-word or sound-a-like substitutions may have occurred due to the inherent limitations of voice recognition software

## 2023-01-22 NOTE — Patient Instructions (Signed)
Please return in 3 months to recheck diabetes and blood pressure    I will release your lab results to you on your MyChart account with further instructions. You may see the results before I do, but when I review them I will send you a message with my report or have my assistant call you if things need to be discussed. Please reply to my message with any questions. Thank you!   If you have any questions or concerns, please don't hesitate to send me a message via MyChart or call the office at [phone]. Thank you for visiting with [place] today! It's our pleasure caring for you.   Please call the office checked below to schedule your appointment for your mammogram and/or bone density screen (the checked studies were ordered): []   Mammogram  [x]   Bone Density  [x]   [facility]     [address] [place], [place]        [phone]         []   [facility]  [address] [place], [place]  [phone]

## 2023-01-23 NOTE — Progress Notes (Signed)
See mychart note Dear Ms. Brienza, Your lab results are all stable.  Your cholesterol levels are improved on Crestor 5 mg daily.  We would love the LDL to be less than 70 but I understand the limitations due to not tolerating a higher dose of Crestor.  Your sugar was elevated as expected.  Continue to eat a healthy diet and monitoring your sugars.  If they continue to rise, we will talk about starting medications. Glad you are doing well overall! Sincerely, Dr. Mardelle Matte

## 2023-01-30 ENCOUNTER — Other Ambulatory Visit: Payer: Self-pay | Admitting: Family Medicine

## 2023-01-30 DIAGNOSIS — Z780 Asymptomatic menopausal state: Secondary | ICD-10-CM

## 2023-01-30 DIAGNOSIS — M810 Age-related osteoporosis without current pathological fracture: Secondary | ICD-10-CM

## 2023-03-19 DIAGNOSIS — H5203 Hypermetropia, bilateral: Secondary | ICD-10-CM | POA: Diagnosis not present

## 2023-03-19 DIAGNOSIS — H2513 Age-related nuclear cataract, bilateral: Secondary | ICD-10-CM | POA: Diagnosis not present

## 2023-03-19 DIAGNOSIS — H18593 Other hereditary corneal dystrophies, bilateral: Secondary | ICD-10-CM | POA: Diagnosis not present

## 2023-03-19 DIAGNOSIS — E119 Type 2 diabetes mellitus without complications: Secondary | ICD-10-CM | POA: Diagnosis not present

## 2023-03-19 LAB — HM DIABETES EYE EXAM

## 2023-05-25 ENCOUNTER — Other Ambulatory Visit: Payer: Self-pay | Admitting: Family Medicine

## 2023-06-02 ENCOUNTER — Other Ambulatory Visit (HOSPITAL_BASED_OUTPATIENT_CLINIC_OR_DEPARTMENT_OTHER): Payer: Self-pay | Admitting: Family Medicine

## 2023-06-02 DIAGNOSIS — Z1231 Encounter for screening mammogram for malignant neoplasm of breast: Secondary | ICD-10-CM

## 2023-06-04 ENCOUNTER — Encounter (HOSPITAL_BASED_OUTPATIENT_CLINIC_OR_DEPARTMENT_OTHER): Payer: Self-pay | Admitting: Radiology

## 2023-06-04 ENCOUNTER — Ambulatory Visit (HOSPITAL_BASED_OUTPATIENT_CLINIC_OR_DEPARTMENT_OTHER)
Admission: RE | Admit: 2023-06-04 | Discharge: 2023-06-04 | Disposition: A | Discharge status: Home / Self Care | Payer: Medicare HMO | Source: Ambulatory Visit | Attending: Family Medicine | Admitting: Family Medicine

## 2023-06-04 DIAGNOSIS — Z1231 Encounter for screening mammogram for malignant neoplasm of breast: Secondary | ICD-10-CM | POA: Insufficient documentation

## 2023-06-11 ENCOUNTER — Other Ambulatory Visit: Payer: Self-pay | Admitting: Family Medicine

## 2023-07-22 ENCOUNTER — Ambulatory Visit: Payer: Medicare HMO | Admitting: Family Medicine

## 2023-07-22 ENCOUNTER — Encounter: Payer: Self-pay | Admitting: Family Medicine

## 2023-07-22 VITALS — BP 129/75 | HR 73 | Temp 97.7°F | Ht 69.0 in | Wt 158.2 lb

## 2023-07-22 DIAGNOSIS — I152 Hypertension secondary to endocrine disorders: Secondary | ICD-10-CM

## 2023-07-22 DIAGNOSIS — E1159 Type 2 diabetes mellitus with other circulatory complications: Secondary | ICD-10-CM

## 2023-07-22 DIAGNOSIS — M810 Age-related osteoporosis without current pathological fracture: Secondary | ICD-10-CM | POA: Diagnosis not present

## 2023-07-22 DIAGNOSIS — E119 Type 2 diabetes mellitus without complications: Secondary | ICD-10-CM

## 2023-07-22 LAB — MICROALBUMIN / CREATININE URINE RATIO
Creatinine,U: 24.3 mg/dL
Microalb Creat Ratio: UNDETERMINED mg/g (ref 0.0–30.0)
Microalb, Ur: <0.7 mg/dL

## 2023-07-22 LAB — POCT GLYCOSYLATED HEMOGLOBIN (HGB A1C): Hemoglobin A1C: 6.8 % — AB (ref 4.0–5.6)

## 2023-07-22 MED ORDER — FREESTYLE LIBRE 3 SENSOR MISC: 11 refills | Status: DC

## 2023-07-22 NOTE — Patient Instructions (Signed)
 Please return in 3 months for diabetes follow up   If you have any questions or concerns, please don't hesitate to send me a message via MyChart or call the office at [phone]. Thank you for visiting with [place] today! It's our pleasure caring for you.   VISIT SUMMARY:  Today, we discussed your concerns about elevated blood sugar levels and reviewed your overall health, including your blood pressure and bone health. We have made a plan to address these issues and will follow up in a few months to monitor your progress.  YOUR PLAN:  -TYPE 2 DIABETES MELLITUS: Type 2 Diabetes Mellitus is a condition where your body does not use insulin properly, leading to elevated blood sugar levels. Your fasting blood glucose levels have been higher than usual, and your HbA1c has increased to 6.8%. We discussed the importance of keeping your HbA1c under 7% to prevent complications. You should continue to monitor your blood glucose levels regularly, increase physical activity as the weather improves, and we will re-evaluate your HbA1c and blood glucose levels in 3 months. Continue using the [place] 3 sensors for glucose monitoring.  -HYPERTENSION: Hypertension is high blood pressure. Your blood pressure is well-controlled at home, although it tends to be higher in the office. No changes to your current management are needed at this time.  -OSTEOPOROSIS: Osteoporosis is a condition where bones become weak and brittle. You have been off Fosamax for a year and have a bone density scan scheduled for April. Depending on the results, we may consider starting Prolia if further treatment is needed, keeping cost considerations in mind.  INSTRUCTIONS:  Please continue to monitor your blood glucose levels and blood pressure at home. We will re-evaluate your HbA1c and blood glucose levels in 3 months. Await the results of your bone density scan in April, and we will discuss potential treatments based on those results.

## 2023-07-22 NOTE — Progress Notes (Signed)
 Subjective  CC: No chief complaint on file.   HPI: Joyce Hatfield is a 78 y.o. female who presents to the office today for follow up of diabetes and problems listed above in the chief complaint.  Discussed the use of AI scribe software for clinical note transcription with the patient, who gave verbal consent to proceed.  History of Present Illness   Joyce Hatfield is a 78 year old female with diabetes who presents with concerns about elevated blood sugar levels.  Over the past few weeks, she has experienced elevated blood glucose levels, with fasting readings around 142 mg/dL, compared to her usual 120 mg/dL. Her hemoglobin A1c has increased to 6.8% from 5.8% a year ago and 6.3% in September. She has not significantly altered her diet or activity level, though she has been taking extra allergy medications. She checks her blood glucose before breakfast and sometimes before lunch, noting persistent elevation even after morning exercise.  She uses a continuous glucose monitor and plans to continue its use.  Her blood pressure readings at home are generally around 115/74 mmHg, although they are higher in the office. She monitors her blood pressure weekly and has been on lisinopril for kidney protection. She is aware of a recent issue with urine protein calculation errors and will provide a urine sample today.  She has been off Fosamax for about a year and is awaiting a bone density scan scheduled for April.  She has a family history of longevity, with two aunts living into their late 16.  No chest pain, and she reports feeling physically well with good endurance. No problems with her feet.      Wt Readings from Last 3 Encounters:  07/22/23 158 lb 3.2 oz (71.8 kg)  01/22/23 160 lb 3.2 oz (72.7 kg)  08/27/22 157 lb (71.2 kg)    BP Readings from Last 3 Encounters:  07/22/23 129/75  01/22/23 124/74  07/15/22 122/60    Assessment  1. Controlled type 2 diabetes mellitus without  complication, without long-term current use of insulin (HCC)   2. Hypertension associated with diabetes (HCC)   3. Osteoporosis, postmenopausal      Plan  Assessment and Plan    Type 2 Diabetes Mellitus Blood glucose elevated with fasting at 142 mg/dL, [id number] increased to 4.4%. Discussed maintaining HbA1c under 7% to prevent complications. Prefers lifestyle modifications over medication. - Monitor blood glucose levels regularly. - Encourage increased physical activity as weather improves. - Re-evaluate HbA1c and blood glucose levels in 3 months. - restart using Libre 3 sensors for glucose monitoring.  Hypertension Blood pressure well-controlled at home, situational elevation in office.  Osteoporosis Off Fosamax for a year, bone density scan scheduled. Discussed Prolia as an alternative if needed, with cost considerations. - Await results of the bone density scan in April. - Consider Prolia or evenity if bone density results indicate the need for further treatment.        Follow up: 3 mo for diabetes recheck Orders Placed This Encounter  Procedures   Microalbumin / creatinine urine ratio   POCT HgB A1C   Meds ordered this encounter  Medications   Continuous Glucose Sensor (FREESTYLE LIBRE 3 SENSOR) MISC    Sig: Apply every 14 days    Dispense:  8 each    Refill:  11      Immunization History  Administered Date(s) Administered   Fluad Quad(high Dose 65+) 03/01/2021, 03/06/2022   Influenza Inj Mdck Quad Pf 05/05/2018  Influenza, High Dose Seasonal PF 02/16/2016, 02/05/2017, 02/03/2019, 01/31/2023   Influenza-Unspecified 02/24/2020   PFIZER Comirnaty(Gray Top)Covid-19 Tri-Sucrose Vaccine 01/25/2022   PFIZER(Purple Top)SARS-COV-2 Vaccination 06/05/2019, 06/30/2019, 02/04/2020, 09/05/2020   Pfizer Covid-19 Vaccine Bivalent Booster 45yrs & up 01/15/2021   Pneumococcal Conjugate-13 06/15/2013   Pneumococcal Polysaccharide-23 09/28/2015   Respiratory Syncytial Virus  Vaccine,Recomb Aduvanted(Arexvy) 02/15/2022   Tdap 10/23/2006, 04/04/2017   Unspecified SARS-COV-2 Vaccination 12/29/2022   Zoster Recombinant(Shingrix) 10/09/2016, 12/22/2016   Zoster, Live 04/30/2007, 03/05/2010    Diabetes Related Lab Review: Lab Results  Component Value Date   HGBA1C 6.3 (A) 01/22/2023   HGBA1C 5.8 (A) 07/01/2022   HGBA1C 6.3 01/04/2022    Lab Results  Component Value Date   MICROALBUR 1.7 01/22/2023   Lab Results  Component Value Date   CREATININE 0.81 01/22/2023   BUN 28 (H) 01/22/2023   NA 139 01/22/2023   K 3.8 01/22/2023   CL 101 01/22/2023   CO2 31 01/22/2023   Lab Results  Component Value Date   CHOL 183 01/22/2023   CHOL 177 01/04/2022   CHOL 169 07/04/2021   Lab Results  Component Value Date   HDL 77.80 01/22/2023   HDL 74.10 01/04/2022   HDL 80.20 07/04/2021   Lab Results  Component Value Date   LDLCALC 92 01/22/2023   LDLCALC 93 01/04/2022   LDLCALC 73 07/04/2021   Lab Results  Component Value Date   TRIG 69.0 01/22/2023   TRIG 51.0 01/04/2022   TRIG 82.0 07/04/2021   Lab Results  Component Value Date   CHOLHDL 2 01/22/2023   CHOLHDL 2 01/04/2022   CHOLHDL 2 07/04/2021   No results found for: "LDLDIRECT" The 10-year ASCVD risk score (Arnett DK, et al., 2019) is: 37.1%   Values used to calculate the score:     Age: 53 years     Sex: Female     Is Non-Hispanic African American: No     Diabetic: Yes     Tobacco smoker: No     Systolic Blood Pressure: 129 mmHg     Is BP treated: Yes     HDL Cholesterol: 77.8 mg/dL     Total Cholesterol: 183 mg/dL I have reviewed the PMH, Fam and Soc history. Patient Active Problem List   Diagnosis Date Noted Date Diagnosed   Combined hyperlipidemia associated with type 2 diabetes mellitus (HCC) 10/04/2021     Priority: High    Can't tolerate crestor 10; takes 5     Controlled type 2 diabetes mellitus without complication, without long-term current use of insulin (HCC) 06/26/2020      Priority: High   Hx of melanoma excision 11/13/2018     Priority: High   Hypertension associated with diabetes (HCC) 03/16/2014     Priority: High   Serrated adenoma of colon 11/08/2013     Priority: High    2015 sessile serrated adenoma size ? And tubular adenoma recheck 5 yr    History of obstructive sleep apnea 07/01/2022     Priority: Medium     Stopped cpap 2024 after 40 pound weight loss    Hepatic steatosis 10/04/2021     Priority: Medium     Ultrasound 2022    Restless legs 11/12/2018     Priority: Medium     Takes daily iron supplement for symptom improvement.    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 Was treated with fosamax x 5 years,  then drug holiday, then restarted 2018. Drug holiday starting 2023  10/2020: T = -2.4 femur. Osteopenia.  09/25/2016 FRAX 19/3.4 ; low impact distal radius fracture. 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  See records from [facility]     Social History: Patient  reports that she has never smoked. She has never used smokeless tobacco. She reports that she does not drink alcohol and does not use drugs.  Review of Systems: Ophthalmic: negative for eye pain, loss of vision or double vision Cardiovascular: negative for chest pain Respiratory: negative for SOB or persistent cough Gastrointestinal: negative for abdominal pain Genitourinary: negative for dysuria or gross hematuria MSK:  negative for foot lesions Neurologic: negative for weakness or gait disturbance  Objective  Vitals: BP 129/75   Pulse 73   Temp 97.7 F (36.5 C)   Ht 5\' 9"  (1.753 m)   Wt 158 lb 3.2 oz (71.8 kg)   SpO2 98%   BMI 23.36 kg/m  General: well appearing, no acute distress  Psych:  Alert and oriented, normal mood and affect HEENT:  Normocephalic, atraumatic, moist mucous membranes, supple neck  Cardiovascular:  Nl S1 and S2, RRR without murmur, gallop or rub. no edema Respiratory:  Good breath sounds bilaterally, CTAB with normal effort, no rales   Diabetic education: ongoing education regarding chronic disease management for diabetes was given today. We continue to reinforce the ABC's of diabetic management: A1c (<7 or 8 dependent upon patient), tight blood pressure control, and cholesterol management with goal LDL < 100 minimally. We discuss diet strategies, exercise recommendations, medication options and possible side effects. At each visit, we review recommended immunizations and preventive care recommendations for diabetics and stress that good diabetic control can prevent other problems. See below for this patient's data.   Commons side effects, risks, benefits, and alternatives for medications and treatment plan prescribed today were discussed, and the patient expressed understanding of the given instructions. Patient is instructed to call or message via MyChart if he/she has any questions or concerns regarding our treatment plan. No barriers to understanding were identified. We discussed Red Flag symptoms and signs in detail. Patient expressed understanding regarding what to do in case of urgent or emergency type symptoms.  Medication list was reconciled, printed and provided to the patient in AVS. Patient instructions and summary information was reviewed with the patient as documented in the AVS. This note was prepared with assistance of Dragon voice recognition software. Occasional  wrong-word or sound-a-like substitutions may have occurred due to the inherent limitations of voice recognition software

## 2023-07-23 ENCOUNTER — Encounter: Payer: Self-pay | Admitting: Family Medicine

## 2023-07-23 NOTE — Progress Notes (Signed)
 See mychart note Dear Ms. Batton, Your urine testing is normal.  Sincerely, Dr. Mardelle Matte

## 2023-08-05 ENCOUNTER — Ambulatory Visit
Admission: RE | Admit: 2023-08-05 | Discharge: 2023-08-05 | Disposition: A | Discharge status: Home / Self Care | Payer: Medicare HMO | Source: Ambulatory Visit | Attending: Family Medicine | Admitting: Family Medicine

## 2023-08-05 DIAGNOSIS — M810 Age-related osteoporosis without current pathological fracture: Secondary | ICD-10-CM | POA: Diagnosis not present

## 2023-08-05 DIAGNOSIS — Z780 Asymptomatic menopausal state: Secondary | ICD-10-CM

## 2023-09-01 ENCOUNTER — Ambulatory Visit (INDEPENDENT_AMBULATORY_CARE_PROVIDER_SITE_OTHER): Payer: Medicare HMO

## 2023-09-01 VITALS — Ht 69.0 in | Wt 158.0 lb

## 2023-09-01 DIAGNOSIS — Z0000 Encounter for general adult medical examination without abnormal findings: Secondary | ICD-10-CM

## 2023-09-01 NOTE — Progress Notes (Signed)
 Subjective:   Joyce Hatfield is a 78 y.o. who presents for a Medicare Wellness preventive visit.  Visit Complete: Virtual I connected with  Joyce Hatfield on 09/01/23 by a audio enabled telemedicine application and verified that I am speaking with the correct person using two identifiers.  Patient Location: Home  Provider Location: Office/Clinic  I discussed the limitations of evaluation and management by telemedicine. The patient expressed understanding and agreed to proceed.  Vital Signs: Because this visit was a virtual/telehealth visit, some criteria may be missing or patient reported. Any vitals not documented were not able to be obtained and vitals that have been documented are patient reported.  VideoDeclined- This patient declined Librarian, academic. Therefore the visit was completed with audio only.  Persons Participating in Visit: Patient.  AWV Questionnaire: No: Patient Medicare AWV questionnaire was not completed prior to this visit.  Cardiac Risk Factors include: advanced age (>35men, >20 women)     Objective:    Today's Vitals   09/01/23 1053  Weight: 158 lb (71.7 kg)  Height: 5\' 9"  (1.753 m)   Body mass index is 23.33 kg/m.     09/01/2023   10:57 AM 08/27/2022   10:10 AM 07/13/2021    8:57 AM 07/19/2020    2:01 PM  Advanced Directives  Does Patient Have a Medical Advance Directive? Yes No No No  Type of Estate agent of [place];Living will     Does patient want to make changes to medical advance directive? No - Patient declined     Copy of Healthcare Power of Attorney in Chart? Yes - validated most recent copy scanned in chart (See row information)     Would patient like information on creating a medical advance directive?  No - Patient declined Yes (MAU/Ambulatory/Procedural Areas - Information given) No - Patient declined    Current Medications (verified) Outpatient Encounter Medications as of  09/01/2023  Medication Sig   Calcium  Carbonate-Vitamin D 600-400 MG-UNIT tablet Take 1 tablet by mouth daily.   Cholecalciferol (VITAMIN D) 1000 UNITS capsule Take 1,000 Units by mouth daily.   Continuous Glucose Sensor (FREESTYLE LIBRE 3 SENSOR) MISC Apply every 14 days   ferrous sulfate 325 (65 FE) MG tablet Take by mouth.   fexofenadine (ALLEGRA) 180 MG tablet Take by mouth.   hydrochlorothiazide  (HYDRODIURIL ) 25 MG tablet TAKE 1 TABLET (25 MG TOTAL) BY MOUTH DAILY.   lisinopril (ZESTRIL) 10 MG tablet TAKE 1 TABLET BY MOUTH EVERY DAY   Multiple Vitamin (MULTIVITAMIN) capsule Take 1 capsule by mouth daily.   Omega-3 Fatty Acids (FISH OIL BURP-LESS) 1000 MG CAPS    rosuvastatin  (CRESTOR ) 5 MG tablet TAKE 1 TABLET BY MOUTH EVERYDAY AT BEDTIME   Turmeric 500 MG CAPS    valACYclovir  (VALTREX ) 500 MG tablet Take 1 tablet (500 mg total) by mouth 2 (two) times daily. (Patient taking differently: Take 500 mg by mouth as needed (PRN).)   vitamin k 100 MCG tablet    No facility-administered encounter medications on file as of 09/01/2023.    Allergies (verified) Bee venom   History: Past Medical History:  Diagnosis Date   Allergy    Cancer (HCC) 2008   melanoma   Diabetes mellitus without complication (HCC)    Hepatic steatosis 10/04/2021   Ultrasound 2022   Hyperlipidemia    Hypertension    Osteoporosis    Sleep apnea    using CPAP since 2016   Past Surgical History:  Procedure  Laterality Date   BIRTH MARK REMOVED  1959   BREAST BIOPSY Right 2010   benign   BROKEN HIP  1995   ENDOMETRIOUSIS LAPAROSCOPY  1985   FRACTURE SURGERY     MELANOMA REMOVED  2008   Family History  Problem Relation Age of Onset   Cancer Mother    Hypertension Mother    Heart disease Father    Hypertension Father    Diabetes Brother    Breast cancer Paternal Aunt    Early death Paternal Grandmother    Heart disease Paternal Grandfather    Social History   Socioeconomic History   Marital status:  Married    Spouse name: Not on file   Number of children: 0   Years of education: Not on file   Highest education level: Bachelor's degree (e.g., BA, AB, BS)  Occupational History   Not on file  Tobacco Use   Smoking status: Never   Smokeless tobacco: Never  Vaping Use   Vaping status: Never Used  Substance and Sexual Activity   Alcohol use: No    Comment: OCCASIONALLY   Drug use: Never   Sexual activity: Not on file  Other Topics Concern   Not on file  Social History Narrative   Not on file   Social Drivers of Health   Financial Resource Strain: Low Risk  (09/01/2023)   Overall Financial Resource Strain (CARDIA)    Difficulty of Paying Living Expenses: Not hard at all  Food Insecurity: No Food Insecurity (09/01/2023)   Hunger Vital Sign    Worried About Running Out of Food in the Last Year: Never true    Ran Out of Food in the Last Year: Never true  Transportation Needs: No Transportation Needs (09/01/2023)   PRAPARE - Administrator, Civil Service (Medical): No    Lack of Transportation (Non-Medical): No  Physical Activity: Sufficiently Active (09/01/2023)   Exercise Vital Sign    Days of Exercise per Week: 7 days    Minutes of Exercise per Session: 100 min  Stress: No Stress Concern Present (09/01/2023)   [employer] of Occupational Health - Occupational Stress Questionnaire    Feeling of Stress : Not at all  Social Connections: Moderately Integrated (09/01/2023)   Social Connection and Isolation Panel [NHANES]    Frequency of Communication with Friends and Family: More than three times a week    Frequency of Social Gatherings with Friends and Family: More than three times a week    Attends Religious Services: Never    Database administrator or Organizations: Yes    Attends Engineer, structural: 1 to 4 times per year    Marital Status: Married    Tobacco Counseling Counseling given: Not Answered    Clinical Intake:  Pre-visit preparation  completed: Yes  Pain : No/denies pain     BMI - recorded: 23.33 Nutritional Status: BMI of 19-24  Normal Diabetes: Yes CBG done?: Yes (160 per pt) CBG resulted in Enter/ Edit results?: No Did pt. bring in CBG monitor from home?: No  Lab Results  Component Value Date   HGBA1C 6.8 (A) 07/22/2023   HGBA1C 6.3 (A) 01/22/2023   HGBA1C 5.8 (A) 07/01/2022     How often do you need to have someone help you when you read instructions, pamphlets, or other written materials from your doctor or pharmacy?: 1 - Never  Interpreter Needed?: No  Information entered by :: Lamont Pilsner, LPN  Activities of Daily Living     09/01/2023   10:55 AM  In your present state of health, do you have any difficulty performing the following activities:  Hearing? 0  Vision? 0  Difficulty concentrating or making decisions? 0  Walking or climbing stairs? 0  Dressing or bathing? 0  Doing errands, shopping? 0  Preparing Food and eating ? N  Using the Toilet? N  In the past six months, have you accidently leaked urine? N  Do you have problems with loss of bowel control? N  Managing your Medications? N  Managing your Finances? N  Housekeeping or managing your Housekeeping? N    Patient Care Team: Luevenia Saha, MD as PCP - General (Family Medicine)  Indicate any recent Medical Services you may have received from other than [facility] providers in the past year (date may be approximate).     Assessment:   This is a routine wellness examination for [facility].  Hearing/Vision screen Vision Screening - Comments:: Wears rx glasses - up to date with routine eye exams with Dr Pauline Bos     Goals Addressed             This Visit's Progress    Patient Stated       Keep A1C down        Depression Screen     09/01/2023   10:58 AM 07/22/2023   10:31 AM 01/22/2023    8:28 AM 08/27/2022   10:11 AM 07/15/2022   10:49 AM 07/01/2022    8:57 AM 01/04/2022   10:45 AM  PHQ 2/9 Scores  PHQ - 2 Score 0 0  0 0 0 0 0  PHQ- 9 Score   2        Fall Risk     09/01/2023   10:59 AM 07/22/2023   10:30 AM 01/22/2023    8:27 AM 08/26/2022   10:46 AM 07/15/2022   10:49 AM  Fall Risk   Falls in the past year? 0 0 1 0 0  Number falls in past yr: 0 0 1  0  Injury with Fall? 0 0 0  0  Risk for fall due to : No Fall Risks No Fall Risks History of fall(s) Impaired vision No Fall Risks  Follow up Falls prevention discussed Falls evaluation completed Falls evaluation completed Falls prevention discussed Falls evaluation completed    MEDICARE RISK AT HOME:  Medicare Risk at Home Any stairs in or around the home?: Yes If so, are there any without handrails?: No Home free of loose throw rugs in walkways, pet beds, electrical cords, etc?: Yes Adequate lighting in your home to reduce risk of falls?: Yes Life alert?: No Use of a cane, walker or w/c?: No Grab bars in the bathroom?: No Shower chair or bench in shower?: No Elevated toilet seat or a handicapped toilet?: No  TIMED UP AND GO:  Was the test performed?  No  Cognitive Function: 6CIT completed        09/01/2023   11:37 AM 08/27/2022   10:13 AM 07/13/2021    9:00 AM  6CIT Screen  What Year? 0 points 0 points 0 points  What month? 0 points 0 points 0 points  What time? 0 points 0 points 0 points  Count back from 20 0 points 0 points 0 points  Months in reverse 0 points 0 points 0 points  Repeat phrase 0 points 0 points 0 points  Total Score 0 points 0 points  0 points    Immunizations Immunization History  Administered Date(s) Administered   Fluad Quad(high Dose 65+) 03/01/2021, 03/06/2022   Influenza Inj Mdck Quad Pf 05/05/2018   Influenza, High Dose Seasonal PF 02/16/2016, 02/05/2017, 02/03/2019, 01/31/2023   Influenza-Unspecified 02/24/2020   PFIZER Comirnaty(Gray Top)Covid-19 Tri-Sucrose Vaccine 01/25/2022   PFIZER(Purple Top)SARS-COV-2 Vaccination 06/05/2019, 06/30/2019, 02/04/2020, 09/05/2020   Pfizer Covid-19 Vaccine Bivalent  Booster 59yrs & up 01/15/2021   Pneumococcal Conjugate-13 06/15/2013   Pneumococcal Polysaccharide-23 09/28/2015   Respiratory Syncytial Virus Vaccine,Recomb Aduvanted(Arexvy) 02/15/2022   Tdap 10/23/2006, 04/04/2017   Unspecified SARS-COV-2 Vaccination 12/29/2022   Zoster Recombinant(Shingrix) 10/09/2016, 12/22/2016   Zoster, Live 04/30/2007, 03/05/2010    Screening Tests Health Maintenance  Topic Date Due   COVID-19 Vaccine (8 - 2024-25 season) 06/28/2023   INFLUENZA VACCINE  11/28/2023   Diabetic kidney evaluation - eGFR measurement  01/22/2024   FOOT EXAM  01/22/2024   HEMOGLOBIN A1C  01/22/2024   Colonoscopy  01/29/2024   OPHTHALMOLOGY EXAM  03/18/2024   MAMMOGRAM  06/03/2024   Diabetic kidney evaluation - Urine ACR  07/21/2024   Medicare Annual Wellness (AWV)  08/31/2024   DEXA SCAN  08/05/2026   DTaP/Tdap/Td (3 - Td or Tdap) 04/05/2027   Pneumonia Vaccine 34+ Years old  Completed   Hepatitis C Screening  Completed   Zoster Vaccines- Shingrix  Completed   HPV VACCINES  Aged Out   Meningococcal B Vaccine  Aged Out    Health Maintenance  Health Maintenance Due  Topic Date Due   COVID-19 Vaccine (8 - 2024-25 season) 06/28/2023   Health Maintenance Items Addressed: See Nurse Notes  Additional Screening:  Vision Screening: Recommended annual ophthalmology exams for early detection of glaucoma and other disorders of the eye.  Dental Screening: Recommended annual dental exams for proper oral hygiene  Community Resource Referral / Chronic Care Management: CRR required this visit?  No   CCM required this visit?  No     Plan:     I have personally reviewed and noted the following in the patient's chart:   Medical and social history Use of alcohol, tobacco or illicit drugs  Current medications and supplements including opioid prescriptions. Patient is not currently taking opioid prescriptions. Functional ability and status Nutritional status Physical  activity Advanced directives List of other physicians Hospitalizations, surgeries, and ER visits in previous 12 months Vitals Screenings to include cognitive, depression, and falls Referrals and appointments  In addition, I have reviewed and discussed with patient certain preventive protocols, quality metrics, and best practice recommendations. A written personalized care plan for preventive services as well as general preventive health recommendations were provided to patient.     Bruno Capri, LPN   4/0/9811   After Visit Summary: (MyChart) Due to this being a telephonic visit, the after visit summary with patients personalized plan was offered to patient via MyChart   Notes: Nothing significant to report at this time.

## 2023-09-01 NOTE — Patient Instructions (Signed)
 Joyce Hatfield , Thank you for taking time to come for your Medicare Wellness Visit. I appreciate your ongoing commitment to your health goals. Please review the following plan we discussed and let me know if I can assist you in the future.   Referrals/Orders/Follow-Ups/Clinician Recommendations: continue working to lower a1C   This is a list of the screening recommended for you and due dates:  Health Maintenance  Topic Date Due   COVID-19 Vaccine (8 - 2024-25 season) 06/28/2023   Flu Shot  11/28/2023   Yearly kidney function blood test for diabetes  01/22/2024   Complete foot exam   01/22/2024   Hemoglobin A1C  01/22/2024   Colon Cancer Screening  01/29/2024   Eye exam for diabetics  03/18/2024   Mammogram  06/03/2024   Yearly kidney health urinalysis for diabetes  07/21/2024   Medicare Annual Wellness Visit  08/31/2024   DEXA scan (bone density measurement)  08/05/2026   DTaP/Tdap/Td vaccine (3 - Td or Tdap) 04/05/2027   Pneumonia Vaccine  Completed   Hepatitis C Screening  Completed   Zoster (Shingles) Vaccine  Completed   HPV Vaccine  Aged Out   Meningitis B Vaccine  Aged Out    Advanced directives: (In Chart) A copy of your advanced directives are scanned into your chart should your provider ever need it.  Next Medicare Annual Wellness Visit scheduled for next year: Yes  Have you seen your provider in the last 6 months (3 months if uncontrolled diabetes)? Yes march 2025

## 2023-09-18 ENCOUNTER — Ambulatory Visit: Payer: Self-pay | Admitting: Family Medicine

## 2023-09-18 NOTE — Progress Notes (Signed)
 See mychart note Dear Ms. Joyce Hatfield, Your bone density results show stable osteoporosis; at your next appointment we will discuss the possibility of restarting medications.  Sincerely, Dr. Jonelle Neri

## 2023-10-16 ENCOUNTER — Other Ambulatory Visit: Payer: Self-pay | Admitting: Family

## 2023-10-16 DIAGNOSIS — I10 Essential (primary) hypertension: Secondary | ICD-10-CM

## 2023-10-22 ENCOUNTER — Ambulatory Visit: Admitting: Family Medicine

## 2023-10-22 VITALS — BP 118/67 | HR 65 | Temp 97.7°F | Ht 69.0 in | Wt 153.4 lb

## 2023-10-22 DIAGNOSIS — E119 Type 2 diabetes mellitus without complications: Secondary | ICD-10-CM

## 2023-10-22 DIAGNOSIS — D126 Benign neoplasm of colon, unspecified: Secondary | ICD-10-CM | POA: Diagnosis not present

## 2023-10-22 DIAGNOSIS — M810 Age-related osteoporosis without current pathological fracture: Secondary | ICD-10-CM | POA: Diagnosis not present

## 2023-10-22 LAB — POCT GLYCOSYLATED HEMOGLOBIN (HGB A1C): Hemoglobin A1C: 6.5 % — AB (ref 4.0–5.6)

## 2023-10-22 MED ORDER — VALACYCLOVIR HCL 500 MG PO TABS: 500.0000 mg | ORAL_TABLET | ORAL | Status: AC | PRN

## 2023-10-22 MED ORDER — ALENDRONATE SODIUM 70 MG PO TABS: 70.0000 mg | ORAL_TABLET | ORAL | 3 refills | Status: AC

## 2023-10-22 NOTE — Progress Notes (Signed)
 Subjective  CC:  Chief Complaint  Patient presents with   Diabetes    HPI: Joyce Hatfield is a 78 y.o. female who presents to the office today for follow up of diabetes and problems listed above in the chief complaint.  Discussed the use of AI scribe software for clinical note transcription with the patient, who gave verbal consent to proceed.  History of Present Illness Joyce Hatfield is a 78 year old female with type 2 diabetes and osteoporosis who presents for follow-up.  Her type 2 diabetes is currently managed through diet, with her A1c decreasing from 6.8 to 6.5. Initially, her glucose meters and sensors indicated high readings, but after adjustments, her 90-day average is now around 148. She feels stable and does not feel overly restricted by her diet. Her weight is down.   Regarding osteoporosis, her bone density results show a T-score of -2.6. She has been on a drug holiday from Fosamax, which she had taken for a year and a half. She previously took Evista without issues. She has concerns about injections due to potential side effects and prefers oral medications.   She is due for a colonoscopy this year, as her last one was five years ago in October. H/o serrated polyp. Last GI in [place].    Wt Readings from Last 3 Encounters:  10/22/23 153 lb 6.4 oz (69.6 kg)  09/01/23 158 lb (71.7 kg)  07/22/23 158 lb 3.2 oz (71.8 kg)    BP Readings from Last 3 Encounters:  10/22/23 118/67  07/22/23 129/75  01/22/23 124/74    Assessment  1. Controlled type 2 diabetes mellitus without complication, without long-term current use of insulin (HCC)   2. Osteoporosis, postmenopausal   3. Serrated adenoma of colon      Plan  Assessment and Plan Assessment & Plan Type 2 Diabetes Mellitus Diabetes well-controlled with diet. A1c decreased from 6.8% to 6.5%. - Continue current dietary management.  Osteoporosis Osteoporosis with T-score of -2.6. Fracture risk stable.  Restarting medication recommended. Prefers oral medications. - Restart Fosamax. Pt refuses prolia or evenity.  - Consider Evista if needed. - Reassess bone density in two years.  General Health Maintenance Due for colonoscopy this year. Last colonoscopy five years ago. - Place referral for colonoscopy.  Follow-up Follow-up scheduled for September for annual physical and blood work. - Conduct annual physical and blood work in September. - Monitor response to Fosamax and adjust treatment if necessary.    Follow up: as scheduled for cpe Orders Placed This Encounter  Procedures   Ambulatory referral to Gastroenterology   POCT HgB A1C   Meds ordered this encounter  Medications   valACYclovir  (VALTREX ) 500 MG tablet    Sig: Take 1 tablet (500 mg total) by mouth as needed (PRN).   alendronate (FOSAMAX) 70 MG tablet    Sig: Take 1 tablet (70 mg total) by mouth once a week. Take with a full glass of water on an empty stomach.    Dispense:  12 tablet    Refill:  3      Immunization History  Administered Date(s) Administered   Fluad Quad(high Dose 65+) 03/01/2021, 03/06/2022   Influenza Inj Mdck Quad Pf 05/05/2018   Influenza, High Dose Seasonal PF 02/16/2016, 02/05/2017, 02/03/2019, 01/31/2023   Influenza-Unspecified 02/24/2020   Moderna Covid-19 Fall Seasonal Vaccine 39yrs & older 09/01/2023   PFIZER Comirnaty(Gray Top)Covid-19 Tri-Sucrose Vaccine 01/25/2022   PFIZER(Purple Top)SARS-COV-2 Vaccination 06/05/2019, 06/30/2019, 02/04/2020, 09/05/2020   Pfizer Covid-19 Vaccine  Bivalent Booster 49yrs & up 01/15/2021   Pneumococcal Conjugate-13 06/15/2013   Pneumococcal Polysaccharide-23 09/28/2015   Respiratory Syncytial Virus Vaccine,Recomb Aduvanted(Arexvy) 02/15/2022   Tdap 10/23/2006, 04/04/2017   Unspecified SARS-COV-2 Vaccination 12/29/2022   Zoster Recombinant(Shingrix) 10/09/2016, 12/22/2016   Zoster, Live 04/30/2007, 03/05/2010    Diabetes Related Lab Review: Lab  Results  Component Value Date   HGBA1C 6.5 (A) 10/22/2023   HGBA1C 6.8 (A) 07/22/2023   HGBA1C 6.3 (A) 01/22/2023    Lab Results  Component Value Date   MICROALBUR <0.7 07/22/2023   Lab Results  Component Value Date   CREATININE 0.81 01/22/2023   BUN 28 (H) 01/22/2023   NA 139 01/22/2023   K 3.8 01/22/2023   CL 101 01/22/2023   CO2 31 01/22/2023   Lab Results  Component Value Date   CHOL 183 01/22/2023   CHOL 177 01/04/2022   CHOL 169 07/04/2021   Lab Results  Component Value Date   HDL 77.80 01/22/2023   HDL 74.10 01/04/2022   HDL 80.20 07/04/2021   Lab Results  Component Value Date   LDLCALC 92 01/22/2023   LDLCALC 93 01/04/2022   LDLCALC 73 07/04/2021   Lab Results  Component Value Date   TRIG 69.0 01/22/2023   TRIG 51.0 01/04/2022   TRIG 82.0 07/04/2021   Lab Results  Component Value Date   CHOLHDL 2 01/22/2023   CHOLHDL 2 01/04/2022   CHOLHDL 2 07/04/2021   No results found for: LDLDIRECT The 10-year ASCVD risk score (Arnett DK, et al., 2019) is: 32.1%   Values used to calculate the score:     Age: 42 years     Clincally relevant sex: Female     Is Non-Hispanic African American: No     Diabetic: Yes     Tobacco smoker: No     Systolic Blood Pressure: 118 mmHg     Is BP treated: Yes     HDL Cholesterol: 77.8 mg/dL     Total Cholesterol: 183 mg/dL I have reviewed the PMH, Fam and Soc history. 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HCC) 10/04/2021    Priority: High    Can't tolerate crestor  10; takes 5     Controlled type 2 diabetes mellitus without complication, without long-term current use of insulin (HCC) 06/26/2020    Priority: High   Hx of melanoma excision 11/13/2018    Priority: High   Hypertension associated with diabetes (HCC) 03/16/2014    Priority: High   Serrated adenoma of colon 11/08/2013    Priority: High    2015 sessile serrated adenoma size  ? And tubular adenoma recheck 5 yr    History of obstructive sleep apnea 07/01/2022    Priority: Medium     Stopped cpap 2024 after 40 pound weight loss    Hepatic steatosis 10/04/2021    Priority: Medium     Ultrasound 2022    Restless legs 11/12/2018    Priority: Medium     Takes daily iron supplement for symptom improvement.    Osteoporosis, postmenopausal 05/05/2018    Priority: Medium     Dexa has been sl osteopenic. Was treated with fosamax x 5 years, then drug holiday, then restarted 2018. Drug holiday starting 2023  DEXA may 2025: lowest T = - 2.6 L hip. Rec restarting treatment. Rec prolia or evenity. To discuss with patient.  10/2020: T = -2.4 femur. Osteopenia.  09/25/2016 FRAX 19/3.4 ;  low impact distal radius fracture. 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  See records from [facility]     Social History: Patient  reports that she has never smoked. She has never used smokeless tobacco. She reports that she does not drink alcohol and does not use drugs.  Review of Systems: Ophthalmic: negative for eye pain, loss of vision or double vision Cardiovascular: negative for chest pain Respiratory: negative for SOB or persistent cough Gastrointestinal: negative for abdominal pain Genitourinary: negative for dysuria or gross hematuria MSK: negative for foot lesions Neurologic: negative for weakness or gait disturbance  Objective  Vitals: BP 118/67   Pulse 65   Temp 97.7 F (36.5 C)    Ht 5' 9 (1.753 m)   Wt 153 lb 6.4 oz (69.6 kg)   SpO2 97%   BMI 22.65 kg/m  General: well appearing, no acute distress  Psych:  Alert and oriented, normal mood and affect HEENT:  Normocephalic, atraumatic, moist mucous membranes, supple neck  Cardiovascular:  Nl S1 and S2, RRR without murmur, gallop or rub. no edema Respiratory:  Good breath sounds bilaterally, CTAB with normal effort, no rales  Diabetic education: ongoing education regarding chronic disease management for diabetes was given today. We continue to reinforce the ABC's of diabetic management: A1c (<7 or 8 dependent upon patient), tight blood pressure control, and cholesterol management with goal LDL < 100 minimally. We discuss diet strategies, exercise recommendations, medication options and possible side effects. At each visit, we review recommended immunizations and preventive care recommendations for diabetics and stress that good diabetic control can prevent other problems. See below for this patient's data. Commons side effects, risks, benefits, and alternatives for medications and treatment plan prescribed today were discussed, and the patient expressed understanding of the given instructions. Patient is instructed to call or message via MyChart if he/she has any questions or concerns regarding our treatment plan. No barriers to understanding were identified. We discussed Red Flag symptoms and signs in detail. Patient expressed understanding regarding what to do in case of urgent or emergency type symptoms.  Medication list was reconciled, printed and provided to the patient in AVS. Patient instructions and summary information was reviewed with the patient as documented in the AVS. This note was prepared with assistance of Dragon voice recognition software. Occasional wrong-word or sound-a-like substitutions may have occurred due to the inherent limitations of voice recognition software

## 2023-10-22 NOTE — Patient Instructions (Signed)
 Please follow up as scheduled for your next visit with me: 01/23/2024   If you have any questions or concerns, please don't hesitate to send me a message via MyChart or call the office at [phone]. Thank you for visiting with us  today! It's our pleasure caring for you.    VISIT SUMMARY: Today, we reviewed your type 2 diabetes and osteoporosis management. Your diabetes is well-controlled with your current diet, and we discussed restarting medication for your osteoporosis. We also talked about your upcoming colonoscopy and scheduled your next follow-up appointment.  YOUR PLAN: -TYPE 2 DIABETES MELLITUS: Your diabetes is well-controlled with your current diet, as evidenced by your A1c decreasing from 6.8% to 6.5%. Continue with your current dietary management to maintain these results.  -OSTEOPOROSIS: Osteoporosis is a condition where bones become weak and brittle. Your T-score is -2.6, indicating osteoporosis. We recommend restarting Fosamax, an oral medication, to help strengthen your bones. If needed, we can consider Evista as an alternative. We will reassess your bone density in two years.  -GENERAL HEALTH MAINTENANCE: You are due for a colonoscopy this year since your last one was five years ago. We will place a referral for you to get this done. Additionally, we have scheduled your annual physical and blood work for September.  INSTRUCTIONS: Please continue with your current diet to manage your diabetes. Restart taking Fosamax as prescribed to help with your osteoporosis. We will reassess your bone density in two years. Make sure to get your colonoscopy done this year, and we will see you in September for your annual physical and blood work.                      Contains text generated by Abridge.                                 Contains text generated by Abridge.

## 2023-12-05 ENCOUNTER — Telehealth: Payer: Self-pay | Admitting: Internal Medicine

## 2023-12-05 NOTE — Telephone Encounter (Signed)
 Good morning Dr. Federico,   DOD AM 8/8  We received a referral for patient to be seen for a colonoscopy. Patient last had a colonoscopy in 2020 in [place]. Patient is requesting to transfer due to no longer living [place]. Patient's previous records are in [employer] for you to review and advise on scheduling.   Thank you

## 2023-12-05 NOTE — Telephone Encounter (Signed)
 Okay to schedule for direct colonoscopy to [facility] for history of colon polyps.  Colonoscopy 01/29/19: Good prep. Normal terminal ileum. Diverticulosis in the sigmoid colon. One 4 mm polyp in the rectum removed with forceps. Non-bleeding internal hemorrhoids.

## 2023-12-11 ENCOUNTER — Other Ambulatory Visit: Payer: Self-pay

## 2023-12-11 ENCOUNTER — Encounter: Payer: Self-pay | Admitting: Family Medicine

## 2023-12-11 DIAGNOSIS — I10 Essential (primary) hypertension: Secondary | ICD-10-CM

## 2023-12-11 MED ORDER — LISINOPRIL 10 MG PO TABS: 10.0000 mg | ORAL_TABLET | Freq: Every day | ORAL | 3 refills | Status: AC

## 2023-12-19 ENCOUNTER — Ambulatory Visit (AMBULATORY_SURGERY_CENTER)

## 2023-12-19 ENCOUNTER — Encounter: Payer: Self-pay | Admitting: Internal Medicine

## 2023-12-19 VITALS — Ht 69.0 in | Wt 153.0 lb

## 2023-12-19 DIAGNOSIS — Z860100 Personal history of colon polyps, unspecified: Secondary | ICD-10-CM

## 2023-12-19 MED ORDER — NA SULFATE-K SULFATE-MG SULF 17.5-3.13-1.6 GM/177ML PO SOLN: 1.0000 | Freq: Once | ORAL | 0 refills | Status: AC

## 2023-12-19 NOTE — Progress Notes (Signed)

## 2023-12-24 ENCOUNTER — Encounter: Payer: Self-pay | Admitting: Internal Medicine

## 2024-01-02 ENCOUNTER — Encounter: Payer: Self-pay | Admitting: Internal Medicine

## 2024-01-02 ENCOUNTER — Ambulatory Visit: Admitting: Internal Medicine

## 2024-01-02 VITALS — BP 126/64 | HR 60 | Temp 97.2°F | Resp 10 | Ht 69.0 in | Wt 153.0 lb

## 2024-01-02 DIAGNOSIS — G4730 Sleep apnea, unspecified: Secondary | ICD-10-CM | POA: Diagnosis not present

## 2024-01-02 DIAGNOSIS — E119 Type 2 diabetes mellitus without complications: Secondary | ICD-10-CM | POA: Diagnosis not present

## 2024-01-02 DIAGNOSIS — D124 Benign neoplasm of descending colon: Secondary | ICD-10-CM

## 2024-01-02 DIAGNOSIS — K5730 Diverticulosis of large intestine without perforation or abscess without bleeding: Secondary | ICD-10-CM

## 2024-01-02 DIAGNOSIS — Z860100 Personal history of colon polyps, unspecified: Secondary | ICD-10-CM | POA: Diagnosis not present

## 2024-01-02 DIAGNOSIS — K648 Other hemorrhoids: Secondary | ICD-10-CM | POA: Diagnosis not present

## 2024-01-02 DIAGNOSIS — Z1211 Encounter for screening for malignant neoplasm of colon: Secondary | ICD-10-CM

## 2024-01-02 DIAGNOSIS — K635 Polyp of colon: Secondary | ICD-10-CM | POA: Diagnosis not present

## 2024-01-02 DIAGNOSIS — I10 Essential (primary) hypertension: Secondary | ICD-10-CM | POA: Diagnosis not present

## 2024-01-02 MED ORDER — SODIUM CHLORIDE 0.9 % IV SOLN: 500.0000 mL | Freq: Once | INTRAVENOUS | Status: DC

## 2024-01-02 NOTE — Op Note (Signed)
 [facility] Patient Name: Joyce Hatfield Procedure Date: 01/02/2024 8:44 AM MRN: [id number] Endoscopist: Rosario Estefana Kidney , , [id number] Age: 78 Referring MD:  Date of Birth: 1945/09/01 Gender: Female Account #: [id number] Procedure:                Colonoscopy Indications:              High risk colon cancer surveillance: Personal                            history of colonic polyps Medicines:                Monitored Anesthesia Care Procedure:                Pre-Anesthesia Assessment:                           - Prior to the procedure, a History and Physical                            was performed, and patient medications and                            allergies were reviewed. The patient's tolerance of                            previous anesthesia was also reviewed. The risks                            and benefits of the procedure and the sedation                            options and risks were discussed with the patient.                            All questions were answered, and informed consent                            was obtained. Prior Anticoagulants: The patient has                            taken no anticoagulant or antiplatelet agents. ASA                            Grade Assessment: III - A patient with severe                            systemic disease. After reviewing the risks and                            benefits, the patient was deemed in satisfactory                            condition to undergo the procedure.  After obtaining informed consent, the colonoscope                            was passed under direct vision. Throughout the                            procedure, the patient's blood pressure, pulse, and                            oxygen saturations were monitored continuously. The                            PCF-HQ190L Colonoscope [id number] was introduced                            through the anus and advanced  to the the terminal                            ileum. The colonoscopy was performed without                            difficulty. The patient tolerated the procedure                            well. The quality of the bowel preparation was                            good. The terminal ileum, ileocecal valve,                            appendiceal orifice, and rectum were photographed. Scope In: 8:47:31 AM Scope Out: 9:08:26 AM Scope Withdrawal Time: 0 hours 15 minutes 58 seconds  Total Procedure Duration: 0 hours 20 minutes 55 seconds  Findings:                 The terminal ileum appeared normal.                           A 4 mm polyp was found in the descending colon. The                            polyp was sessile. The polyp was removed with a                            cold snare. Resection and retrieval were complete.                           Multiple diverticula were found in the sigmoid                            colon.                           Internal hemorrhoids were found during retroflexion. Complications:  No immediate complications. Estimated Blood Loss:     Estimated blood loss was minimal. Impression:               - The examined portion of the ileum was normal.                           - One 4 mm polyp in the descending colon, removed                            with a cold snare. Resected and retrieved.                           - Diverticulosis in the sigmoid colon.                           - Internal hemorrhoids. Recommendation:           - Discharge patient to home (with escort).                           - Await pathology results.                           - The findings and recommendations were discussed                            with the patient. Dr Estefana Federico Rosario Estefana Federico,  01/02/2024 9:13:59 AM

## 2024-01-02 NOTE — Progress Notes (Signed)
 GASTROENTEROLOGY PROCEDURE H&P NOTE   Primary Care Physician: Jodie Lavern CROME, MD    Reason for Procedure:   History of colon polyps  Plan:    Colonoscopy  Patient is appropriate for endoscopic procedure(s) in the ambulatory ([facility]) setting.  The nature of the procedure, as well as the risks, benefits, and alternatives were carefully and thoroughly reviewed with the patient. Ample time for discussion and questions allowed. The patient understood, was satisfied, and agreed to proceed.     HPI: Joyce Hatfield is a 78 y.o. female who presents for colonoscopy for history of colon polyps. Denies blood in stools, changes in bowel habits, or unintentional weight loss. Denies family history of colon cancer.  Colonoscopy 01/29/19: Good prep. Normal terminal ileum. Diverticulosis in the sigmoid colon. One 4 mm polyp in the rectum removed with forceps. Non-bleeding internal hemorrhoids. Repeat recommended in 5 years.  Past Medical History:  Diagnosis Date   Allergy    Cancer (HCC) 2008   melanoma   Diabetes mellitus without complication (HCC)    Hepatic steatosis 10/04/2021   Ultrasound 2022   Hyperlipidemia    Hypertension    Osteoporosis    Sleep apnea    using CPAP since 2016    Past Surgical History:  Procedure Laterality Date   BIRTH MARK REMOVED  1959   BREAST BIOPSY Right 2010   benign   BROKEN HIP  1995   COLONOSCOPY     [facility]   ENDOMETRIOUSIS LAPAROSCOPY  1985   FRACTURE SURGERY     MELANOMA REMOVED  2008   WRIST SURGERY Right     Prior to Admission medications   Medication Sig Start Date End Date Taking? Authorizing Provider  alendronate  (FOSAMAX ) 70 MG tablet Take 1 tablet (70 mg total) by mouth once a week. Take with a full glass of water on an empty stomach. 10/22/23  Yes Jodie Lavern CROME, MD  Calcium  Carbonate-Vitamin D 600-400 MG-UNIT tablet Take 1 tablet by mouth daily.   Yes [provider]  Cholecalciferol (VITAMIN D) 1000 UNITS capsule  Take 1,000 Units by mouth daily.   Yes [provider]  Continuous Glucose Sensor (FREESTYLE LIBRE 3 SENSOR) MISC Apply every 14 days 07/22/23  Yes Jodie Lavern CROME, MD  fexofenadine (ALLEGRA) 180 MG tablet Take by mouth. 09/28/15  Yes [provider]  hydrochlorothiazide  (HYDRODIURIL ) 25 MG tablet TAKE 1 TABLET (25 MG TOTAL) BY MOUTH DAILY. 05/26/23  Yes Jodie Lavern CROME, MD  lisinopril  (ZESTRIL ) 10 MG tablet Take 1 tablet (10 mg total) by mouth daily. 12/11/23  Yes Jodie Lavern CROME, MD  Multiple Vitamin (MULTIVITAMIN) capsule Take 1 capsule by mouth daily.   Yes [provider]  Na Sulfate-K Sulfate-Mg Sulfate concentrate (SUPREP) 17.5-3.13-1.6 GM/177ML SOLN SMARTSIG:1 Kit(s) By Mouth Once 12/19/23  Yes [provider]  Omega-3 Fatty Acids (FISH OIL BURP-LESS) 1000 MG CAPS  09/05/21  Yes [provider]  rosuvastatin  (CRESTOR ) 5 MG tablet TAKE 1 TABLET BY MOUTH EVERYDAY AT BEDTIME 06/11/23  Yes Jodie Lavern CROME, MD  Turmeric 500 MG CAPS  09/05/21  Yes [provider]  vitamin k 100 MCG tablet  03/29/22  Yes [provider]  ferrous sulfate 325 (65 FE) MG tablet Take by mouth.    [provider]  valACYclovir  (VALTREX ) 500 MG tablet Take 1 tablet (500 mg total) by mouth as needed (PRN). 10/22/23   Jodie Lavern CROME, MD    Current Outpatient Medications  Medication Sig Dispense Refill  alendronate  (FOSAMAX ) 70 MG tablet Take 1 tablet (70 mg total) by mouth once a week. Take with a full glass of water on an empty stomach. 12 tablet 3   Calcium  Carbonate-Vitamin D 600-400 MG-UNIT tablet Take 1 tablet by mouth daily.     Cholecalciferol (VITAMIN D) 1000 UNITS capsule Take 1,000 Units by mouth daily.     Continuous Glucose Sensor (FREESTYLE LIBRE 3 SENSOR) MISC Apply every 14 days 8 each 11   fexofenadine (ALLEGRA) 180 MG tablet Take by mouth.     hydrochlorothiazide  (HYDRODIURIL ) 25 MG tablet TAKE 1 TABLET (25 MG TOTAL) BY MOUTH DAILY. 90  tablet 3   lisinopril  (ZESTRIL ) 10 MG tablet Take 1 tablet (10 mg total) by mouth daily. 90 tablet 3   Multiple Vitamin (MULTIVITAMIN) capsule Take 1 capsule by mouth daily.     Na Sulfate-K Sulfate-Mg Sulfate concentrate (SUPREP) 17.5-3.13-1.6 GM/177ML SOLN SMARTSIG:1 Kit(s) By Mouth Once     Omega-3 Fatty Acids (FISH OIL BURP-LESS) 1000 MG CAPS      rosuvastatin  (CRESTOR ) 5 MG tablet TAKE 1 TABLET BY MOUTH EVERYDAY AT BEDTIME 90 tablet 3   Turmeric 500 MG CAPS      vitamin k 100 MCG tablet      ferrous sulfate 325 (65 FE) MG tablet Take by mouth.     valACYclovir  (VALTREX ) 500 MG tablet Take 1 tablet (500 mg total) by mouth as needed (PRN).     Current Facility-Administered Medications  Medication Dose Route Frequency Provider Last Rate Last Admin   0.9 %  sodium chloride  infusion  500 mL Intravenous Once Federico Rosario BROCKS, MD        Allergies as of 01/02/2024 - Review Complete 01/02/2024  Allergen Reaction Noted   Bee venom Swelling 06/14/2020    Family History  Problem Relation Age of Onset   Cancer Mother    Hypertension Mother    Heart disease Father    Hypertension Father    Diabetes Brother    Breast cancer Paternal Aunt    Early death Paternal Grandmother    Heart disease Paternal Grandfather    Colon cancer Neg Hx    Esophageal cancer Neg Hx    Rectal cancer Neg Hx    Stomach cancer Neg Hx     Social History   Socioeconomic History   Marital status: Married    Spouse name: Not on file   Number of children: 0   Years of education: Not on file   Highest education level: Bachelor's degree (e.g., BA, AB, BS)  Occupational History   Not on file  Tobacco Use   Smoking status: Never   Smokeless tobacco: Never  Vaping Use   Vaping status: Never Used  Substance and Sexual Activity   Alcohol use: No    Comment: OCCASIONALLY   Drug use: Never   Sexual activity: Yes    Birth control/protection: Post-menopausal  Other Topics Concern   Not on file  Social History  Narrative   Not on file   Social Drivers of Health   Financial Resource Strain: Low Risk  (10/22/2023)   Overall Financial Resource Strain (CARDIA)    Difficulty of Paying Living Expenses: Not hard at all  Food Insecurity: No Food Insecurity (10/22/2023)   Hunger Vital Sign    Worried About Running Out of Food in the Last Year: Never true    Ran Out of Food in the Last Year: Never true  Transportation Needs: No Transportation Needs (10/22/2023)  PRAPARE - Administrator, Civil Service (Medical): No    Lack of Transportation (Non-Medical): No  Physical Activity: Sufficiently Active (10/22/2023)   Exercise Vital Sign    Days of Exercise per Week: 7 days    Minutes of Exercise per Session: 60 min  Stress: No Stress Concern Present (10/22/2023)   [employer] of Occupational Health - Occupational Stress Questionnaire    Feeling of Stress: Only a little  Social Connections: Moderately Integrated (10/22/2023)   Social Connection and Isolation Panel    Frequency of Communication with Friends and Family: Three times a week    Frequency of Social Gatherings with Friends and Family: Once a week    Attends Religious Services: Patient declined    Database administrator or Organizations: Yes    Attends Engineer, structural: More than 4 times per year    Marital Status: Married  Catering manager Violence: Not At Risk (09/01/2023)   Humiliation, Afraid, Rape, and Kick questionnaire    Fear of Current or Ex-Partner: No    Emotionally Abused: No    Physically Abused: No    Sexually Abused: No    Physical Exam: Vital signs in last 24 hours: BP 118/75   Pulse 68   Temp (!) 97.2 F (36.2 C) (Temporal)   Ht 5' 9 (1.753 m)   Wt 153 lb (69.4 kg)   SpO2 95%   BMI 22.59 kg/m  GEN: NAD EYE: Sclerae anicteric ENT: MMM CV: Non-tachycardic Pulm: No increased work of breathing GI: Soft, NT/ND NEURO:  Alert & Oriented   Estefana Kidney, MD [facility] [facility]  01/02/2024 8:34 AM

## 2024-01-02 NOTE — Patient Instructions (Signed)
 YOU HAD AN ENDOSCOPIC PROCEDURE TODAY AT THE [facility]:   Refer to the procedure report that was given to you for any specific questions about what was found during the examination.  If the procedure report does not answer your questions, please call your gastroenterologist to clarify.  If you requested that your care partner not be given the details of your procedure findings, then the procedure report has been included in a sealed envelope for you to review at your convenience later.  YOU SHOULD EXPECT: Some feelings of bloating in the abdomen. Passage of more gas than usual.  Walking can help get rid of the air that was put into your GI tract during the procedure and reduce the bloating. If you had a lower endoscopy (such as a colonoscopy or flexible sigmoidoscopy) you may notice spotting of blood in your stool or on the toilet paper. If you underwent a bowel prep for your procedure, you may not have a normal bowel movement for a few days.  Please Note:  You might notice some irritation and congestion in your nose or some drainage.  This is from the oxygen used during your procedure.  There is no need for concern and it should clear up in a day or so.  SYMPTOMS TO REPORT IMMEDIATELY:  Following lower endoscopy (colonoscopy or flexible sigmoidoscopy):  Excessive amounts of blood in the stool  Significant tenderness or worsening of abdominal pains  Swelling of the abdomen that is new, acute  Fever of 100F or higher  Resume previous diet Diverticulosis and polyps handouts given Await pathology results   For urgent or emergent issues, a gastroenterologist can be reached at any hour by calling [phone]. Do not use MyChart messaging for urgent concerns.    DIET:  We do recommend a small meal at first, but then you may proceed to your regular diet.  Drink plenty of fluids but you should avoid alcoholic beverages for 24 hours.  ACTIVITY:  You should plan to take it easy for  the rest of today and you should NOT DRIVE or use heavy machinery until tomorrow (because of the sedation medicines used during the test).    FOLLOW UP: Our staff will call the number listed on your records the next business day following your procedure.  We will call around 7:15- 8:00 am to check on you and address any questions or concerns that you may have regarding the information given to you following your procedure. If we do not reach you, we will leave a message.     If any biopsies were taken you will be contacted by phone or by letter within the next 1-3 weeks.  Please call us  at [phone] if you have not heard about the biopsies in 3 weeks.    SIGNATURES/CONFIDENTIALITY: You and/or your care partner have signed paperwork which will be entered into your electronic medical record.  These signatures attest to the fact that that the information above on your After Visit Summary has been reviewed and is understood.  Full responsibility of the confidentiality of this discharge information lies with you and/or your care-partner.

## 2024-01-02 NOTE — Progress Notes (Signed)
 Sedate, gd SR, tolerated procedure well, VSS, report to RN

## 2024-01-02 NOTE — Progress Notes (Signed)
 Pt's states no medical or surgical changes since previsit or office visit.

## 2024-01-02 NOTE — Progress Notes (Signed)
 Called to room to assist during endoscopic procedure.  Patient ID and intended procedure confirmed with present staff. Received instructions for my participation in the procedure from the performing physician.

## 2024-01-04 ENCOUNTER — Encounter: Payer: Self-pay | Admitting: Family Medicine

## 2024-01-05 ENCOUNTER — Other Ambulatory Visit: Payer: Self-pay

## 2024-01-05 ENCOUNTER — Telehealth: Payer: Self-pay | Admitting: *Deleted

## 2024-01-05 DIAGNOSIS — E119 Type 2 diabetes mellitus without complications: Secondary | ICD-10-CM

## 2024-01-05 MED ORDER — FREESTYLE LIBRE 3 PLUS SENSOR MISC: 5 refills | Status: DC

## 2024-01-05 NOTE — Telephone Encounter (Signed)
 Attempted post procedure follow up call.  No answer - LVM.

## 2024-01-06 ENCOUNTER — Ambulatory Visit: Payer: Self-pay | Admitting: Internal Medicine

## 2024-01-06 LAB — SURGICAL PATHOLOGY

## 2024-01-16 ENCOUNTER — Other Ambulatory Visit: Payer: Self-pay | Admitting: Family Medicine

## 2024-01-16 DIAGNOSIS — E119 Type 2 diabetes mellitus without complications: Secondary | ICD-10-CM

## 2024-01-23 ENCOUNTER — Encounter: Payer: Self-pay | Admitting: Family Medicine

## 2024-01-23 ENCOUNTER — Ambulatory Visit: Payer: Medicare HMO | Admitting: Family Medicine

## 2024-01-23 VITALS — BP 125/79 | HR 68 | Temp 97.7°F | Ht 69.0 in | Wt 152.4 lb

## 2024-01-23 DIAGNOSIS — E782 Mixed hyperlipidemia: Secondary | ICD-10-CM

## 2024-01-23 DIAGNOSIS — M810 Age-related osteoporosis without current pathological fracture: Secondary | ICD-10-CM

## 2024-01-23 DIAGNOSIS — E1159 Type 2 diabetes mellitus with other circulatory complications: Secondary | ICD-10-CM

## 2024-01-23 DIAGNOSIS — E119 Type 2 diabetes mellitus without complications: Secondary | ICD-10-CM

## 2024-01-23 DIAGNOSIS — E1169 Type 2 diabetes mellitus with other specified complication: Secondary | ICD-10-CM

## 2024-01-23 DIAGNOSIS — D126 Benign neoplasm of colon, unspecified: Secondary | ICD-10-CM

## 2024-01-23 DIAGNOSIS — Z0001 Encounter for general adult medical examination with abnormal findings: Secondary | ICD-10-CM

## 2024-01-23 DIAGNOSIS — I152 Hypertension secondary to endocrine disorders: Secondary | ICD-10-CM | POA: Diagnosis not present

## 2024-01-23 DIAGNOSIS — K760 Fatty (change of) liver, not elsewhere classified: Secondary | ICD-10-CM | POA: Diagnosis not present

## 2024-01-23 DIAGNOSIS — G2581 Restless legs syndrome: Secondary | ICD-10-CM | POA: Diagnosis not present

## 2024-01-23 LAB — COMPREHENSIVE METABOLIC PANEL WITH GFR
ALT: 11 U/L (ref 0–35)
AST: 16 U/L (ref 0–37)
Albumin: 4.3 g/dL (ref 3.5–5.2)
Alkaline Phosphatase: 61 U/L (ref 39–117)
BUN: 25 mg/dL — ABNORMAL HIGH (ref 6–23)
CO2: 32 meq/L (ref 19–32)
Calcium: 9.9 mg/dL (ref 8.4–10.5)
Chloride: 102 meq/L (ref 96–112)
Creatinine, Ser: 0.77 mg/dL (ref 0.40–1.20)
GFR: 75.27 mL/min (ref >60.00)
Glucose, Bld: 162 mg/dL — ABNORMAL HIGH (ref 70–99)
Potassium: 4.5 meq/L (ref 3.5–5.1)
Sodium: 139 meq/L (ref 135–145)
Total Bilirubin: 1.5 mg/dL — ABNORMAL HIGH (ref 0.2–1.2)
Total Protein: 6.5 g/dL (ref 6.0–8.3)

## 2024-01-23 LAB — CBC WITH DIFFERENTIAL/PLATELET
Basophils Absolute: 0 K/uL (ref 0.0–0.1)
Basophils Relative: 0.8 % (ref 0.0–3.0)
Eosinophils Absolute: 0.1 K/uL (ref 0.0–0.7)
Eosinophils Relative: 2 % (ref 0.0–5.0)
HCT: 42.6 % (ref 36.0–46.0)
Hemoglobin: 14.5 g/dL (ref 12.0–15.0)
Lymphocytes Relative: 27.8 % (ref 12.0–46.0)
Lymphs Abs: 1.4 K/uL (ref 0.7–4.0)
MCHC: 34.1 g/dL (ref 30.0–36.0)
MCV: 86.7 fl (ref 78.0–100.0)
Monocytes Absolute: 0.3 K/uL (ref 0.1–1.0)
Monocytes Relative: 5.3 % (ref 3.0–12.0)
Neutro Abs: 3.3 K/uL (ref 1.4–7.7)
Neutrophils Relative %: 64.1 % (ref 43.0–77.0)
Platelets: 226 K/uL (ref 150.0–400.0)
RBC: 4.92 Mil/uL (ref 3.87–5.11)
RDW: 13.9 % (ref 11.5–15.5)
WBC: 5.1 K/uL (ref 4.0–10.5)

## 2024-01-23 LAB — LIPID PANEL
Cholesterol: 187 mg/dL (ref 0–200)
HDL: 74.2 mg/dL (ref >39.00)
LDL Cholesterol: 100 mg/dL — ABNORMAL HIGH (ref 0–99)
NonHDL: 113.15
Total CHOL/HDL Ratio: 3
Triglycerides: 66 mg/dL (ref 0.0–149.0)
VLDL: 13.2 mg/dL (ref 0.0–40.0)

## 2024-01-23 LAB — HEMOGLOBIN A1C: Hgb A1c MFr Bld: 7.3 % — ABNORMAL HIGH (ref 4.6–6.5)

## 2024-01-23 LAB — MICROALBUMIN / CREATININE URINE RATIO
Creatinine,U: 52 mg/dL
Microalb Creat Ratio: 13.5 mg/g (ref 0.0–30.0)
Microalb, Ur: 0.7 mg/dL (ref 0.0–1.9)

## 2024-01-23 LAB — TSH: TSH: 1.51 u[IU]/mL (ref 0.35–5.50)

## 2024-01-23 NOTE — Patient Instructions (Signed)
 Please return in 6 months to recheck diabetes and blood pressure   I will release your lab results to you on your MyChart account with further instructions. You may see the results before I do, but when I review them I will send you a message with my report or have my assistant call you if things need to be discussed. Please reply to my message with any questions. Thank you!   If you have any questions or concerns, please don't hesitate to send me a message via MyChart or call the office at [phone]. Thank you for visiting with us  today! It's our pleasure caring for you.   Glad you are doing well.

## 2024-01-23 NOTE — Progress Notes (Signed)
 Subjective  Chief Complaint  Patient presents with   Annual Exam    Pt here for Annual Exam and is currently fasting    Diabetes    HPI: Joyce Hatfield is a 78 y.o. female who presents to [facility] [place] today for a Female Wellness Visit. She also has the concerns and/or needs as listed above in the chief complaint. These will be addressed in addition to the Health Maintenance Visit.   Wellness Visit: annual visit with health maintenance review and exam  HM: Normal colonoscopy this summer.  No longer needs screening.  Mammogram due later this year.  Exercises and eats healthy.  Had COVID shot last week.  Will get flu shot at [facility] next week.  Eye exam current. Chronic disease f/u and/or acute problem visit: (deemed necessary to be done in addition to the wellness visit): Discussed the use of AI scribe software for clinical note transcription with the patient, who gave verbal consent to proceed.  History of Present Illness Joyce Hatfield is a 78 year old female with diabetes who presents for routine follow-up of chronic problems.  Diabetes: Diet controlled - Diabetes is generally well-controlled. - Continuous glucose monitor : - Thirty-day in-range percentage is 80%. - Ninety-day in-range percentage is 70%. - Occasional days with poor glycemic control, but overall management is satisfactory. - No symptoms of hyperglycemia.  No foot concerns.  No retinopathy.  On ACE inhibitor and statin.  Hypertension: On lisinopril  and HCTZ.  Well-controlled.  No chest pain shortness of breath or lower extremity edema  Osteoporosis: Restarted Fosamax  in June.  Tolerating well. - Currently taking Fosamax  for bone health. - No adverse effects from Fosamax . - Weightbearing exercise, calcium  and vitamin D  Hyperlipidemia on Crestor .  Fasting for recheck.  Well-tolerated.  Hepatic steatosis: Asymptomatic.  Eating a healthy diet now.  Monitoring.  Restless legs are stable on  iron supplements.  No prescription medicines  Immunization status - Plans to receive influenza vaccination at [facility] next week.   Assessment  1. Encounter for well adult exam with abnormal findings   2. Controlled type 2 diabetes mellitus without complication, without long-term current use of insulin (HCC)   3. Combined hyperlipidemia associated with type 2 diabetes mellitus (HCC)   4. Hypertension associated with diabetes (HCC)   5. Serrated adenoma of colon   6. Hepatic steatosis   7. Osteoporosis, postmenopausal   8. Restless legs   9. Bertrum disease      Plan  Female Wellness Visit: Age appropriate Health Maintenance and Prevention measures were discussed with patient. Included topics are cancer screening recommendations, ways to keep healthy (see AVS) including dietary and exercise recommendations, regular eye and dental care, use of seat belts, and avoidance of moderate alcohol use and tobacco use.  Screens are all current BMI: discussed patient's BMI and encouraged positive lifestyle modifications to help get to or maintain a target BMI. HM needs and immunizations were addressed and ordered. See below for orders. See HM and immunization section for updates.  To get flu shot next week. Routine labs and screening tests ordered including cmp, cbc and lipids where appropriate. Discussed recommendations regarding Vit D and calcium  supplementation (see AVS)  Chronic disease management visit and/or acute problem visit: Assessment and Plan Assessment & Plan Type 2 diabetes mellitus Well-controlled with diet and exercise. Blood glucose levels 80% in range over 30 days, 70% over 90 days. Aware of potential insurance changes for glucose monitors. - Discuss  continuous glucose monitors and insurance requirements in December. - Check A1c and continue diabetic diet - Check urine nephropathy screening on ACE inhibitor - Will get flu shot updated - No retinopathy  Fatty liver  disease Stable.  Avoid processed foods - Order liver function tests.  Hypertension hyperlipidemia have been well-controlled on current medications.  Check lab work.  Osteoporosis Managed with Fosamax , well-tolerated. - Continue Fosamax  as prescribed. - Recheck bone density in 2027  Restless legs: No symptoms.  Stable on iron supplements.  Will monitor.  No blood work for this unless becomes symptomatic.   Follow up: 6 mo for htn and diabetes recheck  Orders Placed This Encounter  Procedures   CBC with Differential/Platelet   Comprehensive metabolic panel with GFR   Lipid panel   Hemoglobin A1c   TSH   Microalbumin / creatinine urine ratio   No orders of the defined types were placed in this encounter.     Body mass index is 22.51 kg/m. Wt Readings from Last 3 Encounters:  01/23/24 152 lb 6.4 oz (69.1 kg)  01/02/24 153 lb (69.4 kg)  12/19/23 153 lb (69.4 kg)     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HCC) 10/04/2021    Priority: High    Can't tolerate crestor  10; takes 5     Controlled type 2 diabetes mellitus without complication, without long-term current use of insulin (HCC) 06/26/2020    Priority: High   Hx of melanoma excision 11/13/2018    Priority: High   Hypertension associated with diabetes (HCC) 03/16/2014    Priority: High   Serrated adenoma of colon 11/08/2013    Priority: High    2015 sessile serrated adenoma size ? And tubular adenoma recheck 5 yr    History of obstructive sleep apnea 07/01/2022    Priority: Medium     Stopped cpap 2024 after 40 pound weight loss    Hepatic steatosis 10/04/2021    Priority: Medium     Ultrasound 2022    Restless legs 11/12/2018    Priority: Medium     Takes daily iron supplement for symptom improvement.    Osteoporosis, postmenopausal 05/05/2018    Priority: Medium     Dexa has been sl osteopenic. Was treated with fosamax  x  5 years, then drug holiday, then restarted 2018. Drug holiday starting 2023  DEXA may 2025: lowest T = - 2.6 L hip. Rec restarting treatment. Rec prolia or evenity. To discuss with patient.  10/2020: T = -2.4 femur. Osteopenia.  09/25/2016 FRAX 19/3.4 ; low impact distal radius fracture. 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  See records from [facility] Maintenance  Topic Date Due   Diabetic kidney evaluation - eGFR measurement  01/22/2024   COVID-19 Vaccine (8 - 2024-25 season) 02/08/2024 (Originally 12/29/2023)   Influenza Vaccine  07/27/2024 (Originally 11/28/2023)   OPHTHALMOLOGY EXAM  03/18/2024   HEMOGLOBIN A1C  04/22/2024   Mammogram  06/03/2024   Diabetic kidney evaluation - Urine ACR  07/21/2024   Medicare Annual Wellness (AWV)  08/31/2024   FOOT EXAM  01/22/2025   DEXA SCAN  08/04/2025   DTaP/Tdap/Td (3 - Td or Tdap) 04/05/2027   Pneumococcal Vaccine: 50+ Years  Completed   Hepatitis C Screening  Completed   Zoster Vaccines- Shingrix  Completed   HPV VACCINES  Aged Out   Meningococcal B Vaccine  Aged Out   Colonoscopy  Discontinued   Immunization History  Administered Date(s) Administered   Fluad Quad(high Dose 65+) 03/01/2021, 03/06/2022   INFLUENZA, HIGH DOSE SEASONAL PF 02/16/2016, 02/05/2017, 02/03/2019, 01/31/2023   Influenza Inj Mdck Quad Pf 05/05/2018   Influenza-Unspecified 02/24/2020   PFIZER Comirnaty(Gray Top)Covid-19 Tri-Sucrose Vaccine 01/25/2022   PFIZER(Purple  Top)SARS-COV-2 Vaccination 06/05/2019, 06/30/2019, 02/04/2020, 09/05/2020   Pfizer Covid-19 Vaccine Bivalent Booster 43yrs & up 01/15/2021   Pneumococcal Conjugate-13 06/15/2013   Pneumococcal Polysaccharide-23 09/28/2015   Respiratory Syncytial Virus Vaccine,Recomb Aduvanted(Arexvy) 02/15/2022   Tdap 10/23/2006, 04/04/2017   Unspecified SARS-COV-2 Vaccination 12/29/2022   Zoster Recombinant(Shingrix) 10/09/2016, 12/22/2016   Zoster, Live 04/30/2007, 03/05/2010   We updated and reviewed the patient's past history in detail and it is documented below. Allergies: Patient is allergic to bee venom. Past Medical History Patient  has a past medical history of Allergy, Cancer (HCC) (2008), Diabetes mellitus without complication (HCC), Hepatic steatosis (10/04/2021), Hyperlipidemia, Hypertension, Osteoporosis, and Sleep apnea. Past Surgical History Patient  has a past surgical history that includes BIRTH MARK REMOVED (1959); BROKEN HIP (1995); MELANOMA REMOVED (2008); ENDOMETRIOUSIS LAPAROSCOPY (1985); Breast biopsy (Right, 2010); Fracture surgery; Wrist surgery (Right); and Colonoscopy. Family History: Patient family history includes Breast cancer in her paternal aunt; Cancer in her mother; Diabetes in her brother; Early death in her paternal grandmother; Heart disease in her father and paternal grandfather; Hypertension in her father and mother. Social History:  Patient  reports that she has never smoked. She has never used smokeless tobacco. She reports that she does not drink alcohol and does not use drugs.  Review of Systems: Constitutional: negative for fever or malaise Ophthalmic: negative for photophobia, double vision or loss of vision Cardiovascular: negative for chest pain, dyspnea on exertion, or new LE swelling Respiratory: negative for SOB or persistent cough Gastrointestinal: negative for abdominal pain, change in bowel habits or melena Genitourinary: negative for dysuria or gross  hematuria, no abnormal uterine bleeding or disharge Musculoskeletal: negative for new gait disturbance or muscular weakness Integumentary: negative for new or persistent rashes, no breast lumps Neurological: negative for TIA or stroke symptoms Psychiatric: negative for SI or delusions Allergic/Immunologic: negative for hives  Patient Care Team    Relationship Specialty Notifications Start End  Jodie Lavern CROME, MD PCP - General Family Medicine  06/14/20     Objective  Vitals: BP 125/79   Pulse 68   Temp 97.7 F (36.5 C)   Ht 5' 9 (1.753 m)   Wt 152 lb 6.4 oz (69.1 kg)   SpO2 97%   BMI 22.51 kg/m  General:  Well developed, well nourished, no acute distress  Psych:  Alert and orientedx3,normal mood and affect HEENT:  Normocephalic, atraumatic, non-icteric sclera,  supple neck without adenopathy, mass or thyromegaly Cardiovascular:  Normal S1, S2, RRR without gallop, rub or murmur Respiratory:  Good breath sounds bilaterally, CTAB with normal respiratory effort Gastrointestinal: normal bowel sounds, soft, non-tender, no  noted masses. No HSM MSK: extremities without edema, joints without erythema or swelling Neurologic:    Mental status is normal.  Gross motor and sensory exams are normal.  No tremor Diabetic Foot Exam: Normal sensation Pulses - +2 distally bilaterally    Commons side effects, risks, benefits, and alternatives for medications and treatment plan prescribed today were discussed, and the patient expressed understanding of the given instructions. Patient is instructed to call or message via MyChart if he/she has any questions or concerns regarding our treatment plan. No barriers to understanding were identified. We discussed Red Flag symptoms and signs in detail. Patient expressed understanding regarding what to do in case of urgent or emergency type symptoms.  Medication list was reconciled, printed and provided to the patient in AVS. Patient instructions and summary  information was reviewed with the patient as documented in the AVS. This note was prepared with assistance of Dragon voice recognition software. Occasional wrong-word or sound-a-like substitutions may have occurred due to the inherent limitations of voice recognition software

## 2024-01-26 ENCOUNTER — Encounter: Payer: Self-pay | Admitting: Family Medicine

## 2024-02-02 ENCOUNTER — Ambulatory Visit: Payer: Self-pay | Admitting: Family Medicine

## 2024-02-02 MED ORDER — METFORMIN HCL 500 MG PO TABS: 500.0000 mg | ORAL_TABLET | Freq: Two times a day (BID) | ORAL | 3 refills | Status: AC

## 2024-02-02 MED ORDER — ROSUVASTATIN CALCIUM 10 MG PO TABS: 10.0000 mg | ORAL_TABLET | Freq: Every day | ORAL | 3 refills | Status: DC

## 2024-02-02 NOTE — Progress Notes (Signed)
 See mychart note Dear Ms. Rosenfield, Yes, your diabetic control has worsened and I agree with starting medications to help lower your numbers. I am sending in metformin 500mg  to take twice a day. As well, I recommend increasing your crestor  dose to 10mg  nightly to push your LDL lower, goal being < 70. I have sent in both medications. Please schedule an office visit in 3 months so we can recheck these numbers.  Sincerely, Dr. Jodie

## 2024-03-07 ENCOUNTER — Emergency Department (HOSPITAL_BASED_OUTPATIENT_CLINIC_OR_DEPARTMENT_OTHER)

## 2024-03-07 ENCOUNTER — Other Ambulatory Visit: Payer: Self-pay

## 2024-03-07 ENCOUNTER — Observation Stay (HOSPITAL_BASED_OUTPATIENT_CLINIC_OR_DEPARTMENT_OTHER)
Admission: EM | Admit: 2024-03-07 | Discharge: 2024-03-08 | Disposition: A | Discharge status: Home / Self Care | Attending: Internal Medicine | Admitting: Internal Medicine

## 2024-03-07 ENCOUNTER — Encounter (HOSPITAL_BASED_OUTPATIENT_CLINIC_OR_DEPARTMENT_OTHER): Payer: Self-pay

## 2024-03-07 DIAGNOSIS — S0081XA Abrasion of other part of head, initial encounter: Secondary | ICD-10-CM | POA: Insufficient documentation

## 2024-03-07 DIAGNOSIS — I10 Essential (primary) hypertension: Secondary | ICD-10-CM | POA: Diagnosis not present

## 2024-03-07 DIAGNOSIS — Z7982 Long term (current) use of aspirin: Secondary | ICD-10-CM | POA: Diagnosis not present

## 2024-03-07 DIAGNOSIS — R55 Syncope and collapse: Secondary | ICD-10-CM | POA: Diagnosis not present

## 2024-03-07 DIAGNOSIS — I152 Hypertension secondary to endocrine disorders: Secondary | ICD-10-CM | POA: Diagnosis present

## 2024-03-07 DIAGNOSIS — W19XXXA Unspecified fall, initial encounter: Secondary | ICD-10-CM | POA: Insufficient documentation

## 2024-03-07 DIAGNOSIS — E1169 Type 2 diabetes mellitus with other specified complication: Secondary | ICD-10-CM | POA: Diagnosis present

## 2024-03-07 DIAGNOSIS — I340 Nonrheumatic mitral (valve) insufficiency: Secondary | ICD-10-CM | POA: Diagnosis not present

## 2024-03-07 DIAGNOSIS — E785 Hyperlipidemia, unspecified: Secondary | ICD-10-CM | POA: Insufficient documentation

## 2024-03-07 DIAGNOSIS — Z85820 Personal history of malignant melanoma of skin: Secondary | ICD-10-CM | POA: Diagnosis not present

## 2024-03-07 DIAGNOSIS — E119 Type 2 diabetes mellitus without complications: Secondary | ICD-10-CM

## 2024-03-07 DIAGNOSIS — Z743 Need for continuous supervision: Secondary | ICD-10-CM | POA: Diagnosis not present

## 2024-03-07 DIAGNOSIS — Z79899 Other long term (current) drug therapy: Secondary | ICD-10-CM | POA: Insufficient documentation

## 2024-03-07 DIAGNOSIS — E7849 Other hyperlipidemia: Secondary | ICD-10-CM | POA: Diagnosis not present

## 2024-03-07 DIAGNOSIS — R0602 Shortness of breath: Secondary | ICD-10-CM | POA: Diagnosis not present

## 2024-03-07 DIAGNOSIS — Z7984 Long term (current) use of oral hypoglycemic drugs: Secondary | ICD-10-CM | POA: Diagnosis not present

## 2024-03-07 LAB — HEPATIC FUNCTION PANEL
ALT: 13 U/L (ref 0–44)
AST: 21 U/L (ref 15–41)
Albumin: 4.4 g/dL (ref 3.5–5.0)
Alkaline Phosphatase: 76 U/L (ref 38–126)
Bilirubin, Direct: 0.4 mg/dL — ABNORMAL HIGH (ref 0.0–0.2)
Indirect Bilirubin: 0.8 mg/dL (ref 0.3–0.9)
Total Bilirubin: 1.3 mg/dL — ABNORMAL HIGH (ref 0.0–1.2)
Total Protein: 6.4 g/dL — ABNORMAL LOW (ref 6.5–8.1)

## 2024-03-07 LAB — TROPONIN T, HIGH SENSITIVITY
Troponin T High Sensitivity: <15 ng/L (ref 0–19)
Troponin T High Sensitivity: <15 ng/L (ref 0–19)

## 2024-03-07 LAB — CBC WITH DIFFERENTIAL/PLATELET
Abs Immature Granulocytes: 0.04 K/uL (ref 0.00–0.07)
Basophils Absolute: 0 K/uL (ref 0.0–0.1)
Basophils Relative: 1 %
Eosinophils Absolute: 0.1 K/uL (ref 0.0–0.5)
Eosinophils Relative: 1 %
HCT: 41.6 % (ref 36.0–46.0)
Hemoglobin: 14.4 g/dL (ref 12.0–15.0)
Immature Granulocytes: 1 %
Lymphocytes Relative: 19 %
Lymphs Abs: 1.4 K/uL (ref 0.7–4.0)
MCH: 30.1 pg (ref 26.0–34.0)
MCHC: 34.6 g/dL (ref 30.0–36.0)
MCV: 87 fL (ref 80.0–100.0)
Monocytes Absolute: 0.4 K/uL (ref 0.1–1.0)
Monocytes Relative: 5 %
Neutro Abs: 5.5 K/uL (ref 1.7–7.7)
Neutrophils Relative %: 73 %
Platelets: 198 K/uL (ref 150–400)
RBC: 4.78 MIL/uL (ref 3.87–5.11)
RDW: 12.6 % (ref 11.5–15.5)
WBC: 7.4 K/uL (ref 4.0–10.5)
nRBC: 0 % (ref 0.0–0.2)

## 2024-03-07 LAB — BASIC METABOLIC PANEL WITH GFR
Anion gap: 10 (ref 5–15)
BUN: 26 mg/dL — ABNORMAL HIGH (ref 8–23)
CO2: 27 mmol/L (ref 22–32)
Calcium: 10.3 mg/dL (ref 8.9–10.3)
Chloride: 100 mmol/L (ref 98–111)
Creatinine, Ser: 0.8 mg/dL (ref 0.44–1.00)
GFR, Estimated: >60 mL/min (ref >60)
Glucose, Bld: 167 mg/dL — ABNORMAL HIGH (ref 70–99)
Potassium: 3.5 mmol/L (ref 3.5–5.1)
Sodium: 137 mmol/L (ref 135–145)

## 2024-03-07 LAB — CBG MONITORING, ED: Glucose-Capillary: 167 mg/dL — ABNORMAL HIGH (ref 70–99)

## 2024-03-07 LAB — URINALYSIS, ROUTINE W REFLEX MICROSCOPIC
Bilirubin Urine: NEGATIVE
Glucose, UA: NEGATIVE mg/dL
Hgb urine dipstick: NEGATIVE
Ketones, ur: NEGATIVE mg/dL
Leukocytes,Ua: NEGATIVE
Nitrite: NEGATIVE
Protein, ur: NEGATIVE mg/dL
Specific Gravity, Urine: 1.022 (ref 1.005–1.030)
pH: 5.5 (ref 5.0–8.0)

## 2024-03-07 LAB — GLUCOSE, CAPILLARY
Glucose-Capillary: 131 mg/dL — ABNORMAL HIGH (ref 70–99)
Glucose-Capillary: 127 mg/dL — ABNORMAL HIGH (ref 70–99)

## 2024-03-07 MED ORDER — LISINOPRIL 10 MG PO TABS
10.0000 mg | ORAL_TABLET | Freq: Every day | ORAL | Status: DC
  Administered 2024-03-08: 10 mg via ORAL
  Filled 2024-03-07: qty 1

## 2024-03-07 MED ORDER — ONDANSETRON HCL 4 MG PO TABS: 4.0000 mg | ORAL_TABLET | Freq: Four times a day (QID) | ORAL | Status: DC | PRN

## 2024-03-07 MED ORDER — METFORMIN HCL 500 MG PO TABS
500.0000 mg | ORAL_TABLET | Freq: Two times a day (BID) | ORAL | Status: DC
  Administered 2024-03-07 – 2024-03-08 (×2): 500 mg via ORAL
  Filled 2024-03-07 (×2): qty 1

## 2024-03-07 MED ORDER — INSULIN ASPART 100 UNIT/ML IJ SOLN
0.0000 [IU] | Freq: Three times a day (TID) | INTRAMUSCULAR | Status: DC
  Administered 2024-03-07 – 2024-03-08 (×3): 1 [IU] via SUBCUTANEOUS
  Filled 2024-03-07 (×3): qty 1

## 2024-03-07 MED ORDER — ROSUVASTATIN CALCIUM 10 MG PO TABS
10.0000 mg | ORAL_TABLET | Freq: Every day | ORAL | Status: DC
  Administered 2024-03-08: 10 mg via ORAL
  Filled 2024-03-07: qty 1

## 2024-03-07 MED ORDER — HYDROCHLOROTHIAZIDE 25 MG PO TABS
25.0000 mg | ORAL_TABLET | Freq: Every day | ORAL | Status: DC
  Administered 2024-03-08: 25 mg via ORAL
  Filled 2024-03-07: qty 1

## 2024-03-07 MED ORDER — SODIUM CHLORIDE 0.9 % IV BOLUS
1000.0000 mL | Freq: Once | INTRAVENOUS | Status: AC
  Administered 2024-03-07: 1000 mL via INTRAVENOUS

## 2024-03-07 MED ORDER — ONDANSETRON HCL 4 MG/2ML IJ SOLN
4.0000 mg | Freq: Four times a day (QID) | INTRAMUSCULAR | Status: DC | PRN
Start: 2024-03-07 — End: 2024-03-08

## 2024-03-07 MED ORDER — LORATADINE 10 MG PO TABS
10.0000 mg | ORAL_TABLET | Freq: Every day | ORAL | Status: DC
  Administered 2024-03-08: 10 mg via ORAL
  Filled 2024-03-07: qty 1

## 2024-03-07 MED ORDER — SODIUM CHLORIDE 0.9% FLUSH
3.0000 mL | Freq: Two times a day (BID) | INTRAVENOUS | Status: DC
  Administered 2024-03-07 – 2024-03-08 (×2): 3 mL via INTRAVENOUS

## 2024-03-07 MED ORDER — ACETAMINOPHEN 325 MG PO TABS: 650.0000 mg | ORAL_TABLET | Freq: Four times a day (QID) | ORAL | Status: DC | PRN

## 2024-03-07 MED ORDER — ACETAMINOPHEN 650 MG RE SUPP
650.0000 mg | Freq: Four times a day (QID) | RECTAL | Status: DC | PRN
Start: 2024-03-07 — End: 2024-03-08

## 2024-03-07 MED ORDER — ENOXAPARIN SODIUM 40 MG/0.4ML IJ SOSY
40.0000 mg | PREFILLED_SYRINGE | INTRAMUSCULAR | Status: DC
  Administered 2024-03-07: 40 mg via SUBCUTANEOUS
  Filled 2024-03-07: qty 0.4

## 2024-03-07 NOTE — Plan of Care (Signed)

## 2024-03-07 NOTE — H&P (Signed)
 History and Physical    LASHAWNE Hatfield FMW:[id number] DOB: 1945-05-21 DOA: 03/07/2024  PCP: Joyce Lavern CROME, MD (Confirm with patient/family/NH records and if not entered, this has to be entered at [facility] point of entry) Patient coming from: Home  I have personally briefly reviewed patient's old medical records in [facility]  Chief Complaint: I passed out  HPI: Joyce Hatfield is a 78 y.o. female with medical history significant of HTN, newly diagnosed IIDM, HLD, presented with syncope.  Patient is active at baseline, she walks 2 to 3 miles daily.  This morning, while walking her dog in the park she started to feel lightheadedness blurry vision collapsed.  There was no weakness around, she estimated  must be several minutes before seeking consciousness, she found her dog was barking at her.  She started to feel severe pain of her face and she right subtle upper lip but denied any tongue biting, no loss control urine or bowel movement.  3 weeks ago, she was started on metformin to address hemoglobin A1c of 7.5, she does wear a continuous glucometer and her glucose level has been 100-120 consistently.  She denies any episodes of hypoglycemia after starting metformin.  Patient reported otherwise she is very healthy and no previous episode of feeling shortness of breath chest pain lightheadedness.  ED Course: Afebrile, no tachycardia blood pressure 140/70 O2 saturation 100% room air.  CT head and neck negative for fracture or dislocation.  Chest x-ray negative for acute infiltrates, EKG showed normal sinus rhythm, no acute ST changes, no acute PR or QTc interval changes.  Blood work showed normal electrolytes and kidney function.  Review of Systems: As per HPI otherwise 14 point review of systems negative.    Past Medical History:  Diagnosis Date   Allergy    Cancer (HCC) 2008   melanoma   Diabetes mellitus without complication (HCC)    Hepatic steatosis 10/04/2021   Ultrasound 2022    Hyperlipidemia    Hypertension    Osteoporosis    Sleep apnea    using CPAP since 2016    Past Surgical History:  Procedure Laterality Date   BIRTH MARK REMOVED  1959   BREAST BIOPSY Right 2010   benign   BROKEN HIP  1995   COLONOSCOPY     [place]   ENDOMETRIOUSIS LAPAROSCOPY  1985   FRACTURE SURGERY     MELANOMA REMOVED  2008   WRIST SURGERY Right      reports that she has never smoked. She has never used smokeless tobacco. She reports that she does not drink alcohol and does not use drugs.  Allergies  Allergen Reactions   Bee Venom Swelling    Localized swelling; does not affect airway    Family History  Problem Relation Age of Onset   Cancer Mother    Hypertension Mother    Heart disease Father    Hypertension Father    Diabetes Brother    Breast cancer Paternal Aunt    Early death Paternal Grandmother    Heart disease Paternal Grandfather    Colon cancer Neg Hx    Esophageal cancer Neg Hx    Rectal cancer Neg Hx    Stomach cancer Neg Hx      Prior to Admission medications   Medication Sig Start Date End Date Taking? Authorizing Provider  alendronate  (FOSAMAX ) 70 MG tablet Take 1 tablet (70 mg total) by mouth once a week. Take with a full glass of water  on an empty stomach. 10/22/23   Joyce Lavern CROME, MD  Calcium  Carbonate-Vitamin D 600-400 MG-UNIT tablet Take 1 tablet by mouth daily.    [provider]  Cholecalciferol (VITAMIN D) 1000 UNITS capsule Take 1,000 Units by mouth daily.    [provider]  Continuous Glucose Sensor (FREESTYLE LIBRE 3 PLUS SENSOR) MISC CHANGE SENSOR EVERY 15 DAYS. 01/16/24   Joyce Lavern CROME, MD  ferrous sulfate 325 (65 FE) MG tablet Take by mouth.    [provider]  fexofenadine (ALLEGRA) 180 MG tablet Take by mouth. 09/28/15   [provider]  hydrochlorothiazide  (HYDRODIURIL ) 25 MG tablet TAKE 1 TABLET (25 MG TOTAL) BY MOUTH DAILY. 05/26/23   Joyce Lavern CROME, MD  lisinopril  (ZESTRIL ) 10 MG  tablet Take 1 tablet (10 mg total) by mouth daily. 12/11/23   Joyce Lavern CROME, MD  metFORMIN (GLUCOPHAGE) 500 MG tablet Take 1 tablet (500 mg total) by mouth 2 (two) times daily with a meal. 02/02/24   Joyce Lavern CROME, MD  Multiple Vitamin (MULTIVITAMIN) capsule Take 1 capsule by mouth daily.    [provider]  Omega-3 Fatty Acids (FISH OIL BURP-LESS) 1000 MG CAPS  09/05/21   [provider]  rosuvastatin  (CRESTOR ) 10 MG tablet Take 1 tablet (10 mg total) by mouth daily. 02/02/24   Joyce Lavern CROME, MD  Turmeric 500 MG CAPS  09/05/21   [provider]  valACYclovir  (VALTREX ) 500 MG tablet Take 1 tablet (500 mg total) by mouth as needed (PRN). 10/22/23   Joyce Lavern CROME, MD  vitamin k 100 MCG tablet  03/29/22   [provider]    Physical Exam: Vitals:   03/07/24 1300 03/07/24 1330 03/07/24 1400 03/07/24 1504  BP: (!) 145/73 139/76 (!) 148/78 (!) 142/65  Pulse: 76 63 79 80  Resp: 20 18 (!) 24 20  Temp:    (!) 97.4 F (36.3 C)  TempSrc:    Oral  SpO2: 98% 100% 99% 98%  Weight:      Height:        Constitutional: NAD, calm, comfortable Vitals:   03/07/24 1300 03/07/24 1330 03/07/24 1400 03/07/24 1504  BP: (!) 145/73 139/76 (!) 148/78 (!) 142/65  Pulse: 76 63 79 80  Resp: 20 18 (!) 24 20  Temp:    (!) 97.4 F (36.3 C)  TempSrc:    Oral  SpO2: 98% 100% 99% 98%  Weight:      Height:       Eyes: PERRL, lids and conjunctivae normal ENMT: Mucous membranes are moist. Posterior pharynx clear of any exudate or lesions.Normal dentition.  Neck: normal, supple, no masses, no thyromegaly Respiratory: clear to auscultation bilaterally, no wheezing, no crackles. Normal respiratory effort. No accessory muscle use.  Cardiovascular: Regular rate and rhythm, soft systolic murmur on heart base. No extremity edema. 2+ pedal pulses. No carotid bruits.  Abdomen: no tenderness, no masses palpated. No hepatosplenomegaly. Bowel sounds positive.  Musculoskeletal: no clubbing  / cyanosis. No joint deformity upper and lower extremities. Good ROM, no contractures. Normal muscle tone.  Skin: no rashes, lesions, ulcers. No induration Neurologic: CN 2-12 grossly intact. Sensation intact, DTR normal. Strength 5/5 in all 4.  Psychiatric: Normal judgment and insight. Alert and oriented x 3. Normal mood.     Labs on Admission: I have personally reviewed following labs and imaging studies  CBC: Recent Labs  Lab 03/07/24 1029  WBC 7.4  NEUTROABS 5.5  HGB 14.4  HCT 41.6  MCV  87.0  PLT 198   Basic Metabolic Panel: Recent Labs  Lab 03/07/24 1029  NA 137  K 3.5  CL 100  CO2 27  GLUCOSE 167*  BUN 26*  CREATININE 0.80  CALCIUM  10.3   GFR: Estimated Creatinine Clearance: 63.5 mL/min (by C-G formula based on SCr of 0.8 mg/dL). Liver Function Tests: Recent Labs  Lab 03/07/24 1029  AST 21  ALT 13  ALKPHOS 76  BILITOT 1.3*  PROT 6.4*  ALBUMIN 4.4   No results for input(s): LIPASE, AMYLASE in the last 168 hours. No results for input(s): AMMONIA in the last 168 hours. Coagulation Profile: No results for input(s): INR, PROTIME in the last 168 hours. Cardiac Enzymes: No results for input(s): CKTOTAL, CKMB, CKMBINDEX, TROPONINI in the last 168 hours. BNP (last 3 results) No results for input(s): PROBNP in the last 8760 hours. HbA1C: No results for input(s): HGBA1C in the last 72 hours. CBG: Recent Labs  Lab 03/07/24 1017 03/07/24 1513  GLUCAP 167* 131*   Lipid Profile: No results for input(s): CHOL, HDL, LDLCALC, TRIG, CHOLHDL, LDLDIRECT in the last 72 hours. Thyroid  Function Tests: No results for input(s): TSH, T4TOTAL, FREET4, T3FREE, THYROIDAB in the last 72 hours. Anemia Panel: No results for input(s): VITAMINB12, FOLATE, FERRITIN, TIBC, IRON, RETICCTPCT in the last 72 hours. Urine analysis:    Component Value Date/Time   COLORURINE YELLOW 03/07/2024 1029   APPEARANCEUR CLEAR  03/07/2024 1029   LABSPEC 1.022 03/07/2024 1029   PHURINE 5.5 03/07/2024 1029   GLUCOSEU NEGATIVE 03/07/2024 1029   HGBUR NEGATIVE 03/07/2024 1029   BILIRUBINUR NEGATIVE 03/07/2024 1029   KETONESUR NEGATIVE 03/07/2024 1029   PROTEINUR NEGATIVE 03/07/2024 1029   NITRITE NEGATIVE 03/07/2024 1029   LEUKOCYTESUR NEGATIVE 03/07/2024 1029    Radiological Exams on Admission: DG Hand Complete Right Result Date: 03/07/2024 EXAM: 3 OR MORE VIEW(S) XRAY OF THE HAND 03/07/2024 11:00:35 AM COMPARISON: None available. CLINICAL HISTORY: fall FINDINGS: BONES AND JOINTS: Partially visualized plate and screw fixation of distal radius in place. Old ulnar styloid fracture. Degenerative changes of first digit interphalangeal joint. No joint dislocation. No acute osseous abnormality. SOFT TISSUES: The soft tissues are unremarkable. IMPRESSION: 1. No acute osseous abnormality. 2. Partially visualized distal radius ORIF, Remote ulnar styloid fracture. Electronically signed by: Helayne Hurst MD 03/07/2024 11:26 AM EST RP Workstation: [id number]   DG Shoulder Right Result Date: 03/07/2024 EXAM: 1 VIEW XRAY OF THE _LATERALITY_ SHOULDER 03/07/2024 11:00:35 AM COMPARISON: None available. CLINICAL HISTORY: fall FINDINGS: BONES AND JOINTS: Glenohumeral joint is normally aligned. No acute fracture or dislocation. Mild degenerative changes of the acromioclavicular joint. SOFT TISSUES: No abnormal calcifications. Visualized lung is unremarkable. IMPRESSION: 1. No acute fracture or dislocation identified about the right shoulder. Electronically signed by: Helayne Hurst MD 03/07/2024 11:24 AM EST RP Workstation: [id number]   CT Maxillofacial Wo Contrast Result Date: 03/07/2024 EXAM: CT OF THE FACE WITHOUT CONTRAST 03/07/2024 11:03:30 AM TECHNIQUE: CT of the face was performed without the administration of intravenous contrast. Multiplanar reformatted images are provided for review. Automated exposure control, iterative reconstruction,  and/or weight based adjustment of the mA/kV was utilized to reduce the radiation dose to as low as reasonably achievable. COMPARISON: CT head and cervical spine today reported separately. CLINICAL HISTORY: 78 year old female status post fall while walking this morning. FINDINGS: FACIAL BONES: No acute traumatic injury identified in the facial bones. No mandibular dislocation. No suspicious bone lesion. ORBITS: Globes are intact. Rightward gaze. Otherwise, orbital soft tissues appear normal. No  acute traumatic injury identified in the orbits. No inflammatory change. SINUSES AND MASTOIDS: Paranasal sinuses, tympanic cavities and mastoids appear clear. SOFT TISSUES: Negative visible non-contrast deep soft tissue spaces of the face and neck. No acute traumatic injury identified in the soft tissues. IMPRESSION: 1. No acute traumatic injury identified in the Face. Electronically signed by: Helayne Hurst MD 03/07/2024 11:23 AM EST RP Workstation: [id number]   CT CERVICAL SPINE WO CONTRAST Result Date: 03/07/2024 EXAM: CT CERVICAL SPINE WITHOUT CONTRAST 03/07/2024 11:03:30 AM TECHNIQUE: CT of the cervical spine was performed without the administration of intravenous contrast. Multiplanar reformatted images are provided for review. Automated exposure control, iterative reconstruction, and/or weight based adjustment of the mA/kV was utilized to reduce the radiation dose to as low as reasonably achievable. COMPARISON: CT face and head CT reported separately 03/07/2024. CLINICAL HISTORY: 78 year old female. Polytrauma, blunt. Status post fall while walking this morning. FINDINGS: CERVICAL SPINE: BONES AND ALIGNMENT: Normal cervical lordosis. Maintained vertebral height. Osteopenia. No acute fracture or traumatic malalignment. DEGENERATIVE CHANGES: Mild for age cervical spine disc and endplate degeneration. No cervical spinal stenosis by CT. SOFT TISSUES: Mild for age cervical carotid calcified atherosclerosis. Negative visible  non-contrast thoracic inlet. No prevertebral soft tissue swelling. IMPRESSION: 1. No acute traumatic injury identified in the cervical spine. 2. Mild for age cervical spine degeneration. Electronically signed by: Helayne Hurst MD 03/07/2024 11:20 AM EST RP Workstation: [id number]   CT HEAD WO CONTRAST Result Date: 03/07/2024 EXAM: CT HEAD WITHOUT CONTRAST 03/07/2024 11:03:30 AM TECHNIQUE: CT of the head was performed without the administration of intravenous contrast. Automated exposure control, iterative reconstruction, and/or weight based adjustment of the mA/kV was utilized to reduce the radiation dose to as low as reasonably achievable. COMPARISON: CT face and cervical spine reported separately today. CLINICAL HISTORY: 78 year old female status post fall this morning while walking. FINDINGS: BRAIN AND VENTRICLES: No acute hemorrhage. No evidence of acute infarct. No hydrocephalus. No extra-axial collection. No mass effect or midline shift. Brain volume normal for age. Normal for age gray white differentiation. No suspicious intracranial vascular hyperdensity. Mild for age calcified atherosclerosis at the skull base. ORBITS: No acute abnormality. No discrete orbit injury identified. SINUSES: Paranasal sinuses, middle ears and mastoids are clear. SOFT TISSUES AND SKULL: No acute soft tissue abnormality. No skull fracture. No discrete scalp soft tissue injury identified. IMPRESSION: 1. No acute traumatic injury identified. 2. Normal for age non-contrast CT appearance of the brain. Electronically signed by: Helayne Hurst MD 03/07/2024 11:18 AM EST RP Workstation: [id number]   DG Chest Port 1 View Result Date: 03/07/2024 EXAM: 1 VIEW(S) XRAY OF THE CHEST 03/07/2024 10:50:00 AM COMPARISON: None available. CLINICAL HISTORY: SOB (shortness of breath) FINDINGS: LUNGS AND PLEURA: Linear scarring in right lower lung. No pulmonary edema. No pleural effusion. No pneumothorax. HEART AND MEDIASTINUM: No acute abnormality of  the cardiac and mediastinal silhouettes. BONES AND SOFT TISSUES: No acute osseous abnormality. IMPRESSION: 1. No acute cardiopulmonary abnormality identified. Electronically signed by: Waddell Calk MD 03/07/2024 11:11 AM EST RP Workstation: [id number]    EKG: Independently reviewed.  Sinus rhythm, no acute ST changes.  Assessment/Plan Principal Problem:   Syncope  (please populate well all problems here in Problem List. (For example, if patient is on BP meds at home and you resume or decide to hold them, it is a problem that needs to be her. Same for CAD, COPD, HLD and so on)  Syncope - With significant prodrome of lightheaded blurred vision, clinically suspect neurological syncope,  monitor orthostatic vital signs, check glucose level 3 times daily AC and at bedtime. - Echocardiogram - Other DDx, telemonitoring x 24 hours, if negative recommend patient follow-up with outpatient cardiology for possible Zio patch versus event monitor.  Clinically, low suspicion for seizure disorder.  Will order a baseline EEG.  IIDM - Continue metformin, low suspicion for hypoglycemia at this point given patient's home record from continuous glucose monitor. - SSI  HTN - Blood pressure stable - Resume HCTZ hydrochlorothiazide  tomorrow  DVT prophylaxis: Lovenox Code Status: Full code Family Communication: Husband at bedside Disposition Plan: Expect less than 2 midnight hospital stay Consults called: None Admission status: Telemetry observation   Cort ONEIDA Mana MD [facility] Pager [phone]  03/07/2024, 3:18 PM

## 2024-03-07 NOTE — Progress Notes (Signed)
 Hx of HTN, DM presented with syncope while walking in a park. Had prodromes of dizziness. Trauma scan negative, vitals stable. EKG 1st degree AV block. Labs ok, no UTI. Admit to Tele obs at [facility].

## 2024-03-07 NOTE — ED Triage Notes (Signed)
 Pt reports she was walking this AM when she felt light-headed and fell into the grass. Pt reports positive LOC. Pt endorses nose/face pain. R shoulder pain. R hand pain.

## 2024-03-07 NOTE — ED Provider Notes (Signed)
 [place] EMERGENCY DEPARTMENT AT [facility] Provider Note   CSN: [id number] Arrival date & time: 03/07/24  1003     Patient presents with: Joyce Hatfield   JINNA WEINMAN is a 78 y.o. female.   Pt is a 78 yo female with pmhx significant for skin cancer, sleep apnea, htn, hld, and dm.  Pt said she is active and walks a lot.  Today, she was coming back from walking her dog.  She felt dizzy and woke up on the ground.  She did eat breakfast prior to this happening.  She is wearing a continuous bs monitor and there was not a dip when this occurred.  She did hit her face and has nose and face pain.  She broke her glasses when she fell.  She also has some right shoulder and hand pain.  No blood thinners.        Prior to Admission medications   Medication Sig Start Date End Date Taking? Authorizing Provider  alendronate  (FOSAMAX ) 70 MG tablet Take 1 tablet (70 mg total) by mouth once a week. Take with a full glass of water on an empty stomach. 10/22/23   Jodie Lavern CROME, MD  Calcium  Carbonate-Vitamin D 600-400 MG-UNIT tablet Take 1 tablet by mouth daily.    [provider]  Cholecalciferol (VITAMIN D) 1000 UNITS capsule Take 1,000 Units by mouth daily.    [provider]  Continuous Glucose Sensor (FREESTYLE LIBRE 3 PLUS SENSOR) MISC CHANGE SENSOR EVERY 15 DAYS. 01/16/24   Jodie Lavern CROME, MD  ferrous sulfate 325 (65 FE) MG tablet Take by mouth.    [provider]  fexofenadine (ALLEGRA) 180 MG tablet Take by mouth. 09/28/15   [provider]  hydrochlorothiazide  (HYDRODIURIL ) 25 MG tablet TAKE 1 TABLET (25 MG TOTAL) BY MOUTH DAILY. 05/26/23   Jodie Lavern CROME, MD  lisinopril  (ZESTRIL ) 10 MG tablet Take 1 tablet (10 mg total) by mouth daily. 12/11/23   Jodie Lavern CROME, MD  metFORMIN (GLUCOPHAGE) 500 MG tablet Take 1 tablet (500 mg total) by mouth 2 (two) times daily with a meal. 02/02/24   Jodie Lavern CROME, MD  Multiple Vitamin (MULTIVITAMIN) capsule Take 1  capsule by mouth daily.    [provider]  Omega-3 Fatty Acids (FISH OIL BURP-LESS) 1000 MG CAPS  09/05/21   [provider]  rosuvastatin  (CRESTOR ) 10 MG tablet Take 1 tablet (10 mg total) by mouth daily. 02/02/24   Jodie Lavern CROME, MD  Turmeric 500 MG CAPS  09/05/21   [provider]  valACYclovir  (VALTREX ) 500 MG tablet Take 1 tablet (500 mg total) by mouth as needed (PRN). 10/22/23   Jodie Lavern CROME, MD  vitamin k 100 MCG tablet  03/29/22   [provider]    Allergies: Bee venom    Review of Systems  HENT:  Positive for facial swelling.        Nose pain  Musculoskeletal:        Right shoulder, right hand pain  Neurological:  Positive for dizziness and syncope.  All other systems reviewed and are negative.   Updated Vital Signs BP (!) 142/72 (BP Location: Right Arm)   Pulse 79   Temp (!) 97.4 F (36.3 C) (Oral)   Resp 16   Ht 5' 9 (1.753 m)   Wt 68 kg   SpO2 99%   BMI 22.15 kg/m   Physical Exam Vitals and nursing note reviewed.  Constitutional:      Appearance:  Normal appearance.  HENT:     Head: Normocephalic.     Comments: Abrasions to nose and to right side of face    Right Ear: External ear normal.     Left Ear: External ear normal.     Nose:     Right Nostril: No epistaxis or septal hematoma.     Left Nostril: No epistaxis or septal hematoma.     Comments: Abrasion to face    Mouth/Throat:     Mouth: Mucous membranes are moist.     Pharynx: Oropharynx is clear.  Neurological:     Mental Status: She is alert.     (all labs ordered are listed, but only abnormal results are displayed) Labs Reviewed  BASIC METABOLIC PANEL WITH GFR - Abnormal; Notable for the following components:      Result Value   Glucose, Bld 167 (*)    BUN 26 (*)    All other components within normal limits  HEPATIC FUNCTION PANEL - Abnormal; Notable for the following components:   Total Protein 6.4 (*)    Total Bilirubin 1.3 (*)    Bilirubin,  Direct 0.4 (*)    All other components within normal limits  CBG MONITORING, ED - Abnormal; Notable for the following components:   Glucose-Capillary 167 (*)    All other components within normal limits  CBC WITH DIFFERENTIAL/PLATELET  URINALYSIS, ROUTINE W REFLEX MICROSCOPIC  CBG MONITORING, ED  TROPONIN T, HIGH SENSITIVITY  TROPONIN T, HIGH SENSITIVITY    EKG: EKG Interpretation Date/Time:  Sunday March 07 2024 10:23:17 EST Ventricular Rate:  69 PR Interval:  226 QRS Duration:  107 QT Interval:  450 QTC Calculation: 483 R Axis:   92  Text Interpretation: Sinus rhythm Prolonged PR interval Right axis deviation Borderline T abnormalities, anterior leads duplicate Confirmed by Dean Clarity [phone]) on 03/07/2024 10:34:46 AM  Radiology: [facility] Hand Complete Right Result Date: 03/07/2024 EXAM: 3 OR MORE VIEW(S) XRAY OF THE HAND 03/07/2024 11:00:35 AM COMPARISON: None available. CLINICAL HISTORY: fall FINDINGS: BONES AND JOINTS: Partially visualized plate and screw fixation of distal radius in place. Old ulnar styloid fracture. Degenerative changes of first digit interphalangeal joint. No joint dislocation. No acute osseous abnormality. SOFT TISSUES: The soft tissues are unremarkable. IMPRESSION: 1. No acute osseous abnormality. 2. Partially visualized distal radius ORIF, Remote ulnar styloid fracture. Electronically signed by: Helayne Hurst MD 03/07/2024 11:26 AM EST RP Workstation: [id number]   DG Shoulder Right Result Date: 03/07/2024 EXAM: 1 VIEW XRAY OF THE _LATERALITY_ SHOULDER 03/07/2024 11:00:35 AM COMPARISON: None available. CLINICAL HISTORY: fall FINDINGS: BONES AND JOINTS: Glenohumeral joint is normally aligned. No acute fracture or dislocation. Mild degenerative changes of the acromioclavicular joint. SOFT TISSUES: No abnormal calcifications. Visualized lung is unremarkable. IMPRESSION: 1. No acute fracture or dislocation identified about the right shoulder. Electronically signed  by: Helayne Hurst MD 03/07/2024 11:24 AM EST RP Workstation: [id number]   CT Maxillofacial Wo Contrast Result Date: 03/07/2024 EXAM: CT OF THE FACE WITHOUT CONTRAST 03/07/2024 11:03:30 AM TECHNIQUE: CT of the face was performed without the administration of intravenous contrast. Multiplanar reformatted images are provided for review. Automated exposure control, iterative reconstruction, and/or weight based adjustment of the mA/kV was utilized to reduce the radiation dose to as low as reasonably achievable. COMPARISON: CT head and cervical spine today reported separately. CLINICAL HISTORY: 78 year old female status post fall while walking this morning. FINDINGS: FACIAL BONES: No acute traumatic injury identified in the facial bones. No mandibular dislocation. No suspicious  bone lesion. ORBITS: Globes are intact. Rightward gaze. Otherwise, orbital soft tissues appear normal. No acute traumatic injury identified in the orbits. No inflammatory change. SINUSES AND MASTOIDS: Paranasal sinuses, tympanic cavities and mastoids appear clear. SOFT TISSUES: Negative visible non-contrast deep soft tissue spaces of the face and neck. No acute traumatic injury identified in the soft tissues. IMPRESSION: 1. No acute traumatic injury identified in the Face. Electronically signed by: Helayne Hurst MD 03/07/2024 11:23 AM EST RP Workstation: [id number]   CT CERVICAL SPINE WO CONTRAST Result Date: 03/07/2024 EXAM: CT CERVICAL SPINE WITHOUT CONTRAST 03/07/2024 11:03:30 AM TECHNIQUE: CT of the cervical spine was performed without the administration of intravenous contrast. Multiplanar reformatted images are provided for review. Automated exposure control, iterative reconstruction, and/or weight based adjustment of the mA/kV was utilized to reduce the radiation dose to as low as reasonably achievable. COMPARISON: CT face and head CT reported separately 03/07/2024. CLINICAL HISTORY: 78 year old female. Polytrauma, blunt. Status post fall  while walking this morning. FINDINGS: CERVICAL SPINE: BONES AND ALIGNMENT: Normal cervical lordosis. Maintained vertebral height. Osteopenia. No acute fracture or traumatic malalignment. DEGENERATIVE CHANGES: Mild for age cervical spine disc and endplate degeneration. No cervical spinal stenosis by CT. SOFT TISSUES: Mild for age cervical carotid calcified atherosclerosis. Negative visible non-contrast thoracic inlet. No prevertebral soft tissue swelling. IMPRESSION: 1. No acute traumatic injury identified in the cervical spine. 2. Mild for age cervical spine degeneration. Electronically signed by: Helayne Hurst MD 03/07/2024 11:20 AM EST RP Workstation: [id number]   CT HEAD WO CONTRAST Result Date: 03/07/2024 EXAM: CT HEAD WITHOUT CONTRAST 03/07/2024 11:03:30 AM TECHNIQUE: CT of the head was performed without the administration of intravenous contrast. Automated exposure control, iterative reconstruction, and/or weight based adjustment of the mA/kV was utilized to reduce the radiation dose to as low as reasonably achievable. COMPARISON: CT face and cervical spine reported separately today. CLINICAL HISTORY: 78 year old female status post fall this morning while walking. FINDINGS: BRAIN AND VENTRICLES: No acute hemorrhage. No evidence of acute infarct. No hydrocephalus. No extra-axial collection. No mass effect or midline shift. Brain volume normal for age. Normal for age gray white differentiation. No suspicious intracranial vascular hyperdensity. Mild for age calcified atherosclerosis at the skull base. ORBITS: No acute abnormality. No discrete orbit injury identified. SINUSES: Paranasal sinuses, middle ears and mastoids are clear. SOFT TISSUES AND SKULL: No acute soft tissue abnormality. No skull fracture. No discrete scalp soft tissue injury identified. IMPRESSION: 1. No acute traumatic injury identified. 2. Normal for age non-contrast CT appearance of the brain. Electronically signed by: Helayne Hurst MD  03/07/2024 11:18 AM EST RP Workstation: [id number]   DG Chest Port 1 View Result Date: 03/07/2024 EXAM: 1 VIEW(S) XRAY OF THE CHEST 03/07/2024 10:50:00 AM COMPARISON: None available. CLINICAL HISTORY: SOB (shortness of breath) FINDINGS: LUNGS AND PLEURA: Linear scarring in right lower lung. No pulmonary edema. No pleural effusion. No pneumothorax. HEART AND MEDIASTINUM: No acute abnormality of the cardiac and mediastinal silhouettes. BONES AND SOFT TISSUES: No acute osseous abnormality. IMPRESSION: 1. No acute cardiopulmonary abnormality identified. Electronically signed by: Waddell Calk MD 03/07/2024 11:11 AM EST RP Workstation: [id number]     Procedures   Medications Ordered in the ED  sodium chloride  0.9 % bolus 1,000 mL (0 mLs Intravenous Stopped 03/07/24 1224)                                    Medical Decision Making  Amount and/or Complexity of Data Reviewed Labs: ordered. Radiology: ordered.  Risk Decision regarding hospitalization.   This patient presents to the ED for concern of syncope, this involves an extensive number of treatment options, and is a complaint that carries with it a high risk of complications and morbidity.  The differential diagnosis includes cardiogenic, vasovagal, orthostatic   Co morbidities that complicate the patient evaluation  skin cancer, sleep apnea, htn, hld, and dm   Additional history obtained:  Additional history obtained from epic chart review External records from outside source obtained and reviewed including husband   Lab Tests:  I Ordered, and personally interpreted labs.  The pertinent results include:  cbc nl, bmp nl other than glucose elevated at 167; lfts nl; ua nl; trop nl   Imaging Studies ordered:  I ordered imaging studies including ct head, ct c spine  I independently visualized and interpreted imaging which showed  CT head: No acute traumatic injury identified.  2. Normal for age non-contrast CT appearance of  the brain.  CT c-spine: No acute traumatic injury identified in the cervical spine.  2. Mild for age cervical spine degeneration.  CT face: No acute traumatic injury identified in the Face.  R shoulder: No acute fracture or dislocation identified about the right shoulder.  R hand:  No acute osseous abnormality.  2. Partially visualized distal radius ORIF, Remote ulnar styloid fracture.  CXR: 1. No acute cardiopulmonary abnormality identified.  I agree with the radiologist interpretation   Cardiac Monitoring:  The patient was maintained on a cardiac monitor.  I personally viewed and interpreted the cardiac monitored which showed an underlying rhythm of: nsr   Medicines ordered and prescription drug management:  I ordered medication including ivfs  for sx  Reevaluation of the patient after these medicines showed that the patient improved I have reviewed the patients home medicines and have made adjustments as needed   Test Considered:  ct   Consultations Obtained:  I requested consultation with the hospitalist (Dr. Laurita),  and discussed lab and imaging findings as well as pertinent plan -he will admit   Problem List / ED Course:  High risk syncope:  unclear etiology.  Pt will be admitted for syncopal obs.   Fall:  no internal trauma, just facial abrasions   Reevaluation:  After the interventions noted above, I reevaluated the patient and found that they have :improved   Social Determinants of Health:  Lives at home   Dispostion:  After consideration of the diagnostic results and the patients response to treatment, I feel that the patent would benefit from admission.       Final diagnoses:  Syncope, unspecified syncope type  Abrasion of face, initial encounter    ED Discharge Orders     None          Dean Clarity, MD 03/07/24 1233

## 2024-03-07 NOTE — ED Notes (Signed)
 [facility]aya w/ cl called for transport

## 2024-03-07 NOTE — ED Notes (Signed)
 Will complete orthostatics after IV fluids are done.

## 2024-03-08 ENCOUNTER — Observation Stay

## 2024-03-08 ENCOUNTER — Observation Stay (HOSPITAL_COMMUNITY)

## 2024-03-08 ENCOUNTER — Observation Stay (HOSPITAL_COMMUNITY)
Admit: 2024-03-08 | Discharge: 2024-03-08 | Disposition: A | Discharge status: Home / Self Care | Attending: Cardiology | Admitting: Cardiology

## 2024-03-08 DIAGNOSIS — R55 Syncope and collapse: Secondary | ICD-10-CM | POA: Diagnosis not present

## 2024-03-08 DIAGNOSIS — R569 Unspecified convulsions: Secondary | ICD-10-CM

## 2024-03-08 DIAGNOSIS — I340 Nonrheumatic mitral (valve) insufficiency: Secondary | ICD-10-CM | POA: Diagnosis present

## 2024-03-08 LAB — BASIC METABOLIC PANEL WITH GFR
Anion gap: 9 (ref 5–15)
BUN: 22 mg/dL (ref 8–23)
CO2: 25 mmol/L (ref 22–32)
Calcium: 9.4 mg/dL (ref 8.9–10.3)
Chloride: 106 mmol/L (ref 98–111)
Creatinine, Ser: 0.72 mg/dL (ref 0.44–1.00)
GFR, Estimated: >60 mL/min (ref >60)
Glucose, Bld: 148 mg/dL — ABNORMAL HIGH (ref 70–99)
Potassium: 3.8 mmol/L (ref 3.5–5.1)
Sodium: 140 mmol/L (ref 135–145)

## 2024-03-08 LAB — CBC
HCT: 42.8 % (ref 36.0–46.0)
Hemoglobin: 14.1 g/dL (ref 12.0–15.0)
MCH: 29.2 pg (ref 26.0–34.0)
MCHC: 32.9 g/dL (ref 30.0–36.0)
MCV: 88.6 fL (ref 80.0–100.0)
Platelets: 193 K/uL (ref 150–400)
RBC: 4.83 MIL/uL (ref 3.87–5.11)
RDW: 12.7 % (ref 11.5–15.5)
WBC: 7.1 K/uL (ref 4.0–10.5)
nRBC: 0 % (ref 0.0–0.2)

## 2024-03-08 LAB — ECHOCARDIOGRAM COMPLETE
Area-P 1/2: 3.87 cm2
Height: 69 in
S' Lateral: 2.5 cm
Weight: 2479.73 [oz_av]

## 2024-03-08 LAB — GLUCOSE, CAPILLARY
Glucose-Capillary: 150 mg/dL — ABNORMAL HIGH (ref 70–99)
Glucose-Capillary: 129 mg/dL — ABNORMAL HIGH (ref 70–99)
Glucose-Capillary: 128 mg/dL — ABNORMAL HIGH (ref 70–99)
Glucose-Capillary: 138 mg/dL — ABNORMAL HIGH (ref 70–99)

## 2024-03-08 LAB — PRO BRAIN NATRIURETIC PEPTIDE: Pro Brain Natriuretic Peptide: 137 pg/mL (ref <300.0)

## 2024-03-08 NOTE — Assessment & Plan Note (Signed)
 Continue rosuvastatin.

## 2024-03-08 NOTE — Assessment & Plan Note (Addendum)
 Uncertain chronicity, but unlikely to contribute Zio patch to r/o arrhythmia in this setting Discussed with cardiology With normal pro-BNP, ok to dc to home with outpatient cardiology f/u

## 2024-03-08 NOTE — Progress Notes (Signed)
 Primary team reached out to on call cardiology to discuss his presentation and echo findings.   Patient presents s/p syncopal event per attending physician. Workup thus far has been unremarkable. But echocardiogram noted MR and the clinical question was could his MR have caused his syncope.   Echo reviewed.   I conveyed that the MR was likely an incidental finding and less likely the cause of his syncope. MR should be followed as outpatient.   Shared decision was to offer the patient cardiac monitor for further surveillance upon discharge and arrange outpatient follow up.   No charge.   Dr. Maxfield Gildersleeve

## 2024-03-08 NOTE — Assessment & Plan Note (Addendum)
 Significant prodrome of lightheadedness, blurred vision Echocardiogram with mitral valve regurgitation, reviewed by Dr. Michele who does not think this is contributing EEG ordered and reported as normal by Dr. Shelton Will dc to home with Zio patch

## 2024-03-08 NOTE — Plan of Care (Signed)
  Problem: Education: Goal: Knowledge of General Education information will improve Description: Including pain rating scale, medication(s)/side effects and non-pharmacologic comfort measures 03/08/2024 1657 by Alaina Dozier PARAS, RN Outcome: Adequate for Discharge 03/08/2024 0823 by Alaina Dozier PARAS, RN Outcome: Progressing   Problem: Health Behavior/Discharge Planning: Goal: Ability to manage health-related needs will improve 03/08/2024 1657 by Alaina Dozier PARAS, RN Outcome: Adequate for Discharge 03/08/2024 0823 by Alaina Dozier PARAS, RN Outcome: Progressing   Problem: Clinical Measurements: Goal: Ability to maintain clinical measurements within normal limits will improve 03/08/2024 1657 by Alaina Dozier PARAS, RN Outcome: Adequate for Discharge 03/08/2024 [id number] by Alaina Dozier PARAS, RN Outcome: Progressing Goal: Will remain free from infection 03/08/2024 1657 by Alaina Dozier PARAS, RN Outcome: Adequate for Discharge 03/08/[phone] [id number] by Alaina Dozier PARAS, RN Outcome: Progressing Goal: Diagnostic test results will improve 03/08/2024 1657 by Alaina Dozier PARAS, RN Outcome: Adequate for Discharge 03/08/2024 [id number] by Alaina Dozier PARAS, RN Outcome: Progressing Goal: Respiratory complications will improve Outcome: Adequate for Discharge Goal: Cardiovascular complication will be avoided Outcome: Adequate for Discharge   Problem: Activity: Goal: Risk for activity intolerance will decrease Outcome: Adequate for Discharge   Problem: Nutrition: Goal: Adequate nutrition will be maintained Outcome: Adequate for Discharge   Problem: Coping: Goal: Level of anxiety will decrease Outcome: Adequate for Discharge   Problem: Elimination: Goal: Will not experience complications related to bowel motility Outcome: Adequate for Discharge Goal: Will not experience complications related to urinary retention Outcome: Adequate for Discharge   Problem: Pain  Managment: Goal: General experience of comfort will improve and/or be controlled Outcome: Adequate for Discharge   Problem: Safety: Goal: Ability to remain free from injury will improve Outcome: Adequate for Discharge   Problem: Skin Integrity: Goal: Risk for impaired skin integrity will decrease Outcome: Adequate for Discharge   Problem: Education: Goal: Ability to describe self-care measures that may prevent or decrease complications (Diabetes Survival Skills Education) will improve Outcome: Adequate for Discharge Goal: Individualized Educational Video(s) Outcome: Adequate for Discharge   Problem: Coping: Goal: Ability to adjust to condition or change in health will improve Outcome: Adequate for Discharge   Problem: Fluid Volume: Goal: Ability to maintain a balanced intake and output will improve Outcome: Adequate for Discharge   Problem: Health Behavior/Discharge Planning: Goal: Ability to identify and utilize available resources and services will improve Outcome: Adequate for Discharge Goal: Ability to manage health-related needs will improve Outcome: Adequate for Discharge   Problem: Metabolic: Goal: Ability to maintain appropriate glucose levels will improve Outcome: Adequate for Discharge   Problem: Nutritional: Goal: Maintenance of adequate nutrition will improve Outcome: Adequate for Discharge Goal: Progress toward achieving an optimal weight will improve Outcome: Adequate for Discharge   Problem: Skin Integrity: Goal: Risk for impaired skin integrity will decrease Outcome: Adequate for Discharge   Problem: Tissue Perfusion: Goal: Adequacy of tissue perfusion will improve Outcome: Adequate for Discharge   Problem: Education: Goal: Knowledge of condition and prescribed therapy will improve Outcome: Adequate for Discharge   Problem: Cardiac: Goal: Will achieve and/or maintain adequate cardiac output Outcome: Adequate for Discharge   Problem: Physical  Regulation: Goal: Complications related to the disease process, condition or treatment will be avoided or minimized Outcome: Adequate for Discharge

## 2024-03-08 NOTE — Progress Notes (Signed)
 EEG complete - results pending

## 2024-03-08 NOTE — Assessment & Plan Note (Addendum)
Continue hydrochlorothiazide and lisinopril. ?

## 2024-03-08 NOTE — Progress Notes (Signed)
   03/08/24 1529  TOC Brief Assessment  Insurance and Status Reviewed  Patient has primary care physician Yes Lanie, Lavern CROME, MD)  Home environment has been reviewed Home with spouse  Prior level of function: Independent  Prior/Current Home Services No current home services  Social Drivers of Health Review SDOH reviewed no interventions necessary  Readmission risk has been reviewed Yes  Transition of care needs no transition of care needs at this time

## 2024-03-08 NOTE — Discharge Summary (Signed)
 Physician Discharge Summary   Patient: Joyce Hatfield MRN: [id number] DOB: December 27, 1945  Admit date:     03/07/2024  Discharge date: 03/08/24  Discharge Physician: Delon Herald   PCP: Jodie Lavern CROME, MD   Recommendations at discharge:   A Zio patch for remote heart monitoring has been ordered and should be mailed to your home Your echocardiogram shows mitral valve regurgitation, which is unlikely to be related to your syncopal event.  A referral to cardiology has been made. Follow up with Dr. Jodie in 1-2 weeks  Discharge Diagnoses: Principal Problem:   Syncope Active Problems:   Hypertension associated with diabetes (HCC)   Controlled type 2 diabetes mellitus without complication, without long-term current use of insulin (HCC)   Combined hyperlipidemia associated with type 2 diabetes mellitus [facility])   Mitral regurgitation   Hospital Course: 77yo with h/o HTN, new T2DM (on Metformin x 3 weeks), and HLD who presented on 11/9 with syncope.  Echo, EEG ordered.  Assessment and Plan:  Assessment & Plan Syncope Significant prodrome of lightheadedness, blurred vision Echocardiogram with mitral valve regurgitation, reviewed by Dr. Michele who does not think this is contributing EEG ordered and reported as normal by Dr. Shelton Will dc to home with Zio patch Hypertension associated with diabetes (HCC) Continue hydrochlorothiazide  and lisinopril  Controlled type 2 diabetes mellitus without complication, without long-term current use of insulin (HCC) Recent A1c 7.3 Resume Glucophage Carb modified diet  Combined hyperlipidemia associated with type 2 diabetes mellitus (HCC) Continue rosuvastatin  Mitral regurgitation Uncertain chronicity, but unlikely to contribute Zio patch to r/o arrhythmia in this setting Discussed with cardiology With normal pro-BNP, ok to dc to home with outpatient cardiology f/u      Consultants: None  Procedures: Echocardiogram 11/10 EEG  11/10  Antibiotics: None      Pain control - [place]  Controlled Substance Reporting System database was reviewed. and patient was instructed, not to drive, operate heavy machinery, perform activities at heights, swimming or participation in water activities or provide baby-sitting services while on Pain, Sleep and Anxiety Medications; until their outpatient Physician has advised to do so again. Also recommended to not to take more than prescribed Pain, Sleep and Anxiety Medications.   Disposition: Home Diet recommendation:  Regular diet DISCHARGE MEDICATION: Allergies as of 03/08/2024       Reactions   Bee Venom Swelling   Localized swelling; does not affect airway        Medication List     TAKE these medications    alendronate  70 MG tablet Commonly known as: Fosamax  Take 1 tablet (70 mg total) by mouth once a week. Take with a full glass of water on an empty stomach.   Calcium  Carbonate-Vitamin D 600-400 MG-UNIT tablet Take 1 tablet by mouth daily.   ferrous sulfate 325 (65 FE) MG tablet Take by mouth.   fexofenadine 180 MG tablet Commonly known as: ALLEGRA Take by mouth.   Fish Oil Burp-Less 1000 MG Caps   FreeStyle Libre 3 Plus Sensor Misc CHANGE SENSOR EVERY 15 DAYS.   hydrochlorothiazide  25 MG tablet Commonly known as: HYDRODIURIL  TAKE 1 TABLET (25 MG TOTAL) BY MOUTH DAILY.   lisinopril  10 MG tablet Commonly known as: ZESTRIL  Take 1 tablet (10 mg total) by mouth daily.   metFORMIN 500 MG tablet Commonly known as: GLUCOPHAGE Take 1 tablet (500 mg total) by mouth 2 (two) times daily with a meal.   multivitamin capsule Take 1 capsule by mouth daily.   rosuvastatin  5 MG  tablet Commonly known as: CRESTOR  Take 5 mg by mouth at bedtime. What changed: Another medication with the same name was removed. Continue taking this medication, and follow the directions you see here.   Turmeric 500 MG Caps   valACYclovir  500 MG tablet Commonly known as:  Valtrex  Take 1 tablet (500 mg total) by mouth as needed (PRN).   Vitamin D 1000 units capsule Take 1,000 Units by mouth daily.   vitamin k 100 MCG tablet        Follow-up Information     Tolia, Sunit, DO Follow up.   Specialties: Cardiology, Vascular Surgery Contact information: [address] [place] [address] [phone]         Jodie Lavern CROME, MD. Schedule an appointment as soon as possible for a visit in 1 week(s).   Specialty: Family Medicine Contact information: [address] [place] [address] [phone]                Discharge Exam:   Subjective: Feeling better.   No further episodes.  Has facial excoriations and edema from fall.   Objective: Vitals:   03/08/24 0400 03/08/24 1304  BP: (!) 149/74 138/86  Pulse: 72 77  Resp: 16 16  Temp: 97.6 F (36.4 C) (!) 97.5 F (36.4 C)  SpO2: 97% 97%    Intake/Output Summary (Last 24 hours) at 03/08/2024 1609 Last data filed at 03/08/2024 1304 Gross per 24 hour  Intake 480 ml  Output 2 ml  Net 478 ml   Filed Weights   03/07/24 1009 03/08/24 0500  Weight: 68 kg 70.3 kg    Exam:  General:  Appears calm and comfortable and is in NAD Eyes:  normal lids, iris, facial excoriations noted ENT:  grossly normal hearing, lips & tongue, mmm Cardiovascular:  RRR, no m/r/g. No LE edema.  Respiratory:   CTA bilaterally with no wheezes/rales/rhonchi.  Normal respiratory effort. Abdomen:  soft, NT, ND Skin:  no rash or induration seen on limited exam; mostly facial excoriations and edema from fall Musculoskeletal:  grossly normal tone BUE/BLE, good ROM, no bony abnormality Psychiatric:  grossly normal mood and affect, speech fluent and appropriate, AOx3 Neurologic:  CN 2-12 grossly intact, moves all extremities in coordinated fashion  Data Reviewed: I have reviewed the patient's lab results since admission.  Pertinent labs for today include:   Glucose 148 Pro-BNP 137 Normal  CBC    Condition at discharge: stable  The results of significant diagnostics from this hospitalization (including imaging, microbiology, ancillary and laboratory) are listed below for reference.   Imaging Studies: EEG adult Result Date: 03/08/2024 Shelton Arlin KIDD, MD     03/08/2024  4:07 PM Patient Name: Joyce Hatfield MRN: [id number] Epilepsy Attending: Arlin KIDD Shelton Referring Physician/Provider: Laurita Cort DASEN, MD Date: 03/08/2024 Duration: 22.23 mins Patient history: 78yo F with syncope. EEG to evaluate for seizure Level of alertness: Awake, asleep AEDs during EEG study: None Technical aspects: This EEG study was done with scalp electrodes positioned according to the 10-20 International system of electrode placement. Electrical activity was reviewed with band pass filter of 1-70Hz , sensitivity of 7 uV/mm, display speed of 28mm/sec with a 60Hz  notched filter applied as appropriate. EEG data were recorded continuously and digitally stored.  Video monitoring was available and reviewed as appropriate. Description: The posterior dominant rhythm consists of 8 Hz activity of moderate voltage (25-35 uV) seen predominantly in posterior head regions, symmetric and reactive to eye opening and eye closing. Sleep was  characterized by vertex waves, sleep spindles (12 to 14 Hz), maximal frontocentral region. Hyperventilation and photic stimulation were not performed.   IMPRESSION: This study is within normal limits. No seizures or epileptiform discharges were seen throughout the recording. A normal interictal EEG does not exclude the diagnosis of epilepsy. Arlin MALVA Krebs   ECHOCARDIOGRAM COMPLETE Result Date: 03/08/2024    ECHOCARDIOGRAM REPORT   Patient Name:   Joyce Hatfield Date of Exam: 03/08/2024 Medical Rec #:  [id number]        Height:       69.0 in Accession #:    [id number]       Weight:       155.0 lb Date of Birth:  1945-11-26       BSA:          1.854 m Patient Age:    77 years         BP:            149/74 mmHg Patient Gender: F                HR:           67 bpm. Exam Location:  Inpatient Procedure: 2D Echo (Both Spectral and Color Flow Doppler were utilized during            procedure). Indications:    Syncope  History:        Patient has no prior history of Echocardiogram examinations.  Sonographer:    Charmaine Gaskins Referring Phys: [id number] PING T ZHANG IMPRESSIONS  1. Left ventricular ejection fraction, by estimation, is 60 to 65%. The left ventricle has normal function. The left ventricle has no regional wall motion abnormalities. Left ventricular diastolic parameters were normal.  2. Right ventricular systolic function is normal. The right ventricular size is normal.  3. The mitral valve is abnormal. Moderate mitral valve regurgitation. No evidence of mitral stenosis. 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 The mean mitral valve gradient is 2.0 mmHg.  4. The aortic valve is normal in structure. Aortic valve regurgitation is not visualized. No aortic stenosis is present.  5. The inferior vena cava is normal in size with greater than 50% respiratory variability, suggesting right atrial pressure of 3 mmHg.  6. Mitral Regurgitation is at least moderate in severity but appers to be a wall jet in some views and therefore severity may be underestimated. Consider TEE for further assessment. FINDINGS  Left Ventricle: Left ventricular ejection fraction, by estimation, is 60 to 65%. The left ventricle has normal function. The left ventricle has no regional wall motion abnormalities. The left ventricular internal cavity size was normal in size. There is  no left ventricular hypertrophy. Left ventricular diastolic parameters were normal. Normal left ventricular filling pressure. Right Ventricle: The right ventricular size is normal. No increase in right ventricular wall thickness. Right ventricular systolic function is normal. Left Atrium: Left atrial size was normal in size.  Right Atrium: Right atrial size was normal in size. Pericardium: There is no evidence of pericardial effusion. Mitral Valve: The mitral valve is abnormal. 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 Moderate mitral valve regurgitation. No evidence of mitral valve stenosis. MV peak gradient, 3.7 mmHg. The mean mitral valve gradient is 2.0 mmHg. Tricuspid Valve: The tricuspid valve is normal in structure. Tricuspid valve regurgitation is mild . No evidence of tricuspid stenosis. Aortic Valve: The aortic valve is normal  in structure. Aortic valve regurgitation is not visualized. No aortic stenosis is present. Pulmonic Valve: The pulmonic valve was normal in structure. Pulmonic valve regurgitation is trivial. No evidence of pulmonic stenosis. Aorta: The aortic root is normal in size and structure. Venous: The inferior vena cava is normal in size with greater than 50% respiratory variability, suggesting right atrial pressure of 3 mmHg. IAS/Shunts: No atrial level shunt detected by color flow Doppler.  LEFT VENTRICLE PLAX 2D LVIDd:         4.70 cm   Diastology LVIDs:         2.50 cm   LV e' medial:    7.40 cm/s LV PW:         1.00 cm   LV E/e' medial:  10.8 LV IVS:        1.00 cm   LV e' lateral:   11.80 cm/s LVOT diam:     2.10 cm   LV E/e' lateral: 6.8 LVOT Area:     3.46 cm  RIGHT VENTRICLE RV Basal diam:  2.90 cm RV Mid diam:    2.70 cm RV S prime:     14.50 cm/s LEFT ATRIUM             Index        RIGHT ATRIUM           Index LA diam:        3.80 cm 2.05 cm/m   RA Area:     15.60 cm LA Vol (A2C):   55.0 ml 29.67 ml/m  RA Volume:   36.90 ml  19.90 ml/m LA Vol (A4C):   61.1 ml 32.96 ml/m LA Biplane Vol: 60.0 ml 32.37 ml/m   AORTA Ao Root diam: 3.00 cm Ao Asc diam:  3.60 cm MITRAL VALVE MV Area (PHT): 3.87 cm     SHUNTS MV Peak grad:  3.7 mmHg     Systemic Diam: 2.10 cm MV Mean grad:  2.0 mmHg MV Vmax:       0.96 m/s MV Vmean:      61.1 cm/s MV Decel Time: 196 msec MV E velocity:  80.10 cm/s MV A velocity: 102.00 cm/s MV E/A ratio:  0.79 Wilbert Bihari MD Electronically signed by Wilbert Bihari MD Signature Date/Time: 03/08/2024/9:23:08 AM    Final    DG Hand Complete Right Result Date: 03/07/2024 EXAM: 3 OR MORE VIEW(S) XRAY OF THE HAND 03/07/2024 11:00:35 AM COMPARISON: None available. CLINICAL HISTORY: fall FINDINGS: BONES AND JOINTS: Partially visualized plate and screw fixation of distal radius in place. Old ulnar styloid fracture. Degenerative changes of first digit interphalangeal joint. No joint dislocation. No acute osseous abnormality. SOFT TISSUES: The soft tissues are unremarkable. IMPRESSION: 1. No acute osseous abnormality. 2. Partially visualized distal radius ORIF, Remote ulnar styloid fracture. Electronically signed by: Helayne Hurst MD 03/07/2024 11:26 AM EST RP Workstation: [id number]   DG Shoulder Right Result Date: 03/07/2024 EXAM: 1 VIEW XRAY OF THE _LATERALITY_ SHOULDER 03/07/2024 11:00:35 AM COMPARISON: None available. CLINICAL HISTORY: fall FINDINGS: BONES AND JOINTS: Glenohumeral joint is normally aligned. No acute fracture or dislocation. Mild degenerative changes of the acromioclavicular joint. SOFT TISSUES: No abnormal calcifications. Visualized lung is unremarkable. IMPRESSION: 1. No acute fracture or dislocation identified about the right shoulder. Electronically signed by: Helayne Hurst MD 03/07/2024 11:24 AM EST RP Workstation: [id number]   CT Maxillofacial Wo Contrast Result Date: 03/07/2024 EXAM: CT OF THE FACE WITHOUT CONTRAST 03/07/2024 11:03:30 AM TECHNIQUE: CT of the face was performed  without the administration of intravenous contrast. Multiplanar reformatted images are provided for review. Automated exposure control, iterative reconstruction, and/or weight based adjustment of the mA/kV was utilized to reduce the radiation dose to as low as reasonably achievable. COMPARISON: CT head and cervical spine today reported separately. CLINICAL HISTORY: 78  year old female status post fall while walking this morning. FINDINGS: FACIAL BONES: No acute traumatic injury identified in the facial bones. No mandibular dislocation. No suspicious bone lesion. ORBITS: Globes are intact. Rightward gaze. Otherwise, orbital soft tissues appear normal. No acute traumatic injury identified in the orbits. No inflammatory change. SINUSES AND MASTOIDS: Paranasal sinuses, tympanic cavities and mastoids appear clear. SOFT TISSUES: Negative visible non-contrast deep soft tissue spaces of the face and neck. No acute traumatic injury identified in the soft tissues. IMPRESSION: 1. No acute traumatic injury identified in the Face. Electronically signed by: Helayne Hurst MD 03/07/2024 11:23 AM EST RP Workstation: [id number]   CT CERVICAL SPINE WO CONTRAST Result Date: 03/07/2024 EXAM: CT CERVICAL SPINE WITHOUT CONTRAST 03/07/2024 11:03:30 AM TECHNIQUE: CT of the cervical spine was performed without the administration of intravenous contrast. Multiplanar reformatted images are provided for review. Automated exposure control, iterative reconstruction, and/or weight based adjustment of the mA/kV was utilized to reduce the radiation dose to as low as reasonably achievable. COMPARISON: CT face and head CT reported separately 03/07/2024. CLINICAL HISTORY: 78 year old female. Polytrauma, blunt. Status post fall while walking this morning. FINDINGS: CERVICAL SPINE: BONES AND ALIGNMENT: Normal cervical lordosis. Maintained vertebral height. Osteopenia. No acute fracture or traumatic malalignment. DEGENERATIVE CHANGES: Mild for age cervical spine disc and endplate degeneration. No cervical spinal stenosis by CT. SOFT TISSUES: Mild for age cervical carotid calcified atherosclerosis. Negative visible non-contrast thoracic inlet. No prevertebral soft tissue swelling. IMPRESSION: 1. No acute traumatic injury identified in the cervical spine. 2. Mild for age cervical spine degeneration. Electronically signed  by: Helayne Hurst MD 03/07/2024 11:20 AM EST RP Workstation: [id number]   CT HEAD WO CONTRAST Result Date: 03/07/2024 EXAM: CT HEAD WITHOUT CONTRAST 03/07/2024 11:03:30 AM TECHNIQUE: CT of the head was performed without the administration of intravenous contrast. Automated exposure control, iterative reconstruction, and/or weight based adjustment of the mA/kV was utilized to reduce the radiation dose to as low as reasonably achievable. COMPARISON: CT face and cervical spine reported separately today. CLINICAL HISTORY: 78 year old female status post fall this morning while walking. FINDINGS: BRAIN AND VENTRICLES: No acute hemorrhage. No evidence of acute infarct. No hydrocephalus. No extra-axial collection. No mass effect or midline shift. Brain volume normal for age. Normal for age gray white differentiation. No suspicious intracranial vascular hyperdensity. Mild for age calcified atherosclerosis at the skull base. ORBITS: No acute abnormality. No discrete orbit injury identified. SINUSES: Paranasal sinuses, middle ears and mastoids are clear. SOFT TISSUES AND SKULL: No acute soft tissue abnormality. No skull fracture. No discrete scalp soft tissue injury identified. IMPRESSION: 1. No acute traumatic injury identified. 2. Normal for age non-contrast CT appearance of the brain. Electronically signed by: Helayne Hurst MD 03/07/2024 11:18 AM EST RP Workstation: [id number]   DG Chest Port 1 View Result Date: 03/07/2024 EXAM: 1 VIEW(S) XRAY OF THE CHEST 03/07/2024 10:50:00 AM COMPARISON: None available. CLINICAL HISTORY: SOB (shortness of breath) FINDINGS: LUNGS AND PLEURA: Linear scarring in right lower lung. No pulmonary edema. No pleural effusion. No pneumothorax. HEART AND MEDIASTINUM: No acute abnormality of the cardiac and mediastinal silhouettes. BONES AND SOFT TISSUES: No acute osseous abnormality. IMPRESSION: 1. No acute cardiopulmonary abnormality identified.  Electronically signed by: Waddell Calk MD  03/07/2024 11:11 AM EST RP Workstation: [id number]    Microbiology: No results found for this or any previous visit.  Labs: CBC: Recent Labs  Lab 03/07/24 1029 03/08/24 0857  WBC 7.4 7.1  NEUTROABS 5.5  --   HGB 14.4 14.1  HCT 41.6 42.8  MCV 87.0 88.6  PLT 198 193   Basic Metabolic Panel: Recent Labs  Lab 03/07/24 1029 03/08/24 0857  NA 137 140  K 3.5 3.8  CL 100 106  CO2 27 25  GLUCOSE 167* 148*  BUN 26* 22  CREATININE 0.80 0.72  CALCIUM  10.3 9.4   Liver Function Tests: Recent Labs  Lab 03/07/24 1029  AST 21  ALT 13  ALKPHOS 76  BILITOT 1.3*  PROT 6.4*  ALBUMIN 4.4   CBG: Recent Labs  Lab 03/07/24 1513 03/07/24 2153 03/08/24 0620 03/08/24 0751 03/08/24 1107  GLUCAP 131* 127* 150* 129* 128*    Discharge time spent: greater than 30 minutes.  Signed: Delon Herald, MD [facility] 03/08/2024

## 2024-03-08 NOTE — Progress Notes (Signed)
 Ordered 14 day live monitor to be placed on patient prior to discharge for syncope Dr. Michele to read.

## 2024-03-08 NOTE — Hospital Course (Signed)
 77yo with h/o HTN, new T2DM (on Metformin x 3 weeks), and HLD who presented on 11/9 with syncope.  Echo, EEG ordered.

## 2024-03-08 NOTE — Procedures (Signed)
 Patient Name: ESTALEE MCCANDLISH  MRN: [id number]  Epilepsy Attending: Arlin MALVA Krebs  Referring Physician/Provider: Laurita Cort DASEN, MD  Date: 03/08/2024 Duration: 22.23 mins  Patient history: 78yo F with syncope. EEG to evaluate for seizure  Level of alertness: Awake, asleep  AEDs during EEG study: None  Technical aspects: This EEG study was done with scalp electrodes positioned according to the 10-20 International system of electrode placement. Electrical activity was reviewed with band pass filter of 1-70Hz , sensitivity of 7 uV/mm, display speed of 39mm/sec with a 60Hz  notched filter applied as appropriate. EEG data were recorded continuously and digitally stored.  Video monitoring was available and reviewed as appropriate.  Description: The posterior dominant rhythm consists of 8 Hz activity of moderate voltage (25-35 uV) seen predominantly in posterior head regions, symmetric and reactive to eye opening and eye closing. Sleep was characterized by vertex waves, sleep spindles (12 to 14 Hz), maximal frontocentral region. Hyperventilation and photic stimulation were not performed.     IMPRESSION: This study is within normal limits. No seizures or epileptiform discharges were seen throughout the recording.  A normal interictal EEG does not exclude the diagnosis of epilepsy.   Hasaan Radde O Laird Runnion

## 2024-03-08 NOTE — Care Management Obs Status (Signed)
 MEDICARE OBSERVATION STATUS NOTIFICATION   Patient Details  Name: Joyce Hatfield MRN: [id number] Date of Birth: 12-Feb-1946   Medicare Observation Status Notification Given:  Yes    Toy LITTIE Agar, RN 03/08/2024, 3:25 PM

## 2024-03-08 NOTE — Progress Notes (Signed)
 AVS reviewed with patient who verbalized an understanding. Patient had questions about wearing the Zio patch while doing water aerobics/patient  will call Zio directly with questions. Patient is planning on going to water aerobics next week.  PIV removed as noted. Pt is dressing  dressing for discharge to home. Husband enroute.

## 2024-03-08 NOTE — Plan of Care (Signed)
  Problem: Education: Goal: Knowledge of General Education information will improve Description: Including pain rating scale, medication(s)/side effects and non-pharmacologic comfort measures Outcome: Progressing   Problem: Health Behavior/Discharge Planning: Goal: Ability to manage health-related needs will improve Outcome: Progressing   Problem: Clinical Measurements: Goal: Ability to maintain clinical measurements within normal limits will improve Outcome: Progressing Goal: Will remain free from infection Outcome: Progressing Goal: Diagnostic test results will improve Outcome: Progressing Goal: Respiratory complications will improve Outcome: Progressing Goal: Cardiovascular complication will be avoided Outcome: Progressing   Problem: Activity: Goal: Risk for activity intolerance will decrease Outcome: Progressing   Problem: Nutrition: Goal: Adequate nutrition will be maintained Outcome: Progressing   Problem: Coping: Goal: Level of anxiety will decrease Outcome: Progressing   Problem: Elimination: Goal: Will not experience complications related to bowel motility Outcome: Progressing Goal: Will not experience complications related to urinary retention Outcome: Progressing   Problem: Pain Managment: Goal: General experience of comfort will improve and/or be controlled Outcome: Progressing   Problem: Safety: Goal: Ability to remain free from injury will improve Outcome: Progressing   Problem: Skin Integrity: Goal: Risk for impaired skin integrity will decrease Outcome: Progressing   Problem: Education: Goal: Ability to describe self-care measures that may prevent or decrease complications (Diabetes Survival Skills Education) will improve Outcome: Progressing Goal: Individualized Educational Video(s) Outcome: Progressing   Problem: Coping: Goal: Ability to adjust to condition or change in health will improve Outcome: Progressing   Problem: Fluid  Volume: Goal: Ability to maintain a balanced intake and output will improve Outcome: Progressing   Problem: Health Behavior/Discharge Planning: Goal: Ability to identify and utilize available resources and services will improve Outcome: Progressing Goal: Ability to manage health-related needs will improve Outcome: Progressing   Problem: Metabolic: Goal: Ability to maintain appropriate glucose levels will improve Outcome: Progressing   Problem: Nutritional: Goal: Maintenance of adequate nutrition will improve Outcome: Progressing Goal: Progress toward achieving an optimal weight will improve Outcome: Progressing   Problem: Skin Integrity: Goal: Risk for impaired skin integrity will decrease Outcome: Progressing   Problem: Tissue Perfusion: Goal: Adequacy of tissue perfusion will improve Outcome: Progressing   Problem: Education: Goal: Knowledge of condition and prescribed therapy will improve Outcome: Progressing   Problem: Cardiac: Goal: Will achieve and/or maintain adequate cardiac output Outcome: Progressing   Problem: Physical Regulation: Goal: Complications related to the disease process, condition or treatment will be avoided or minimized Outcome: Progressing

## 2024-03-08 NOTE — Progress Notes (Unsigned)
 Enrolled for [employer] to mail a ZIO AT Live Telemetry monitor to patients address on file.

## 2024-03-08 NOTE — Progress Notes (Signed)
 Mobility Specialist Progress Note:   03/08/24 1412  Mobility  Activity Ambulated independently  Level of Assistance Independent  Assistive Device None  Distance Ambulated (ft) 400 ft  Activity Response Tolerated well  Mobility Referral Yes  Mobility visit 1 Mobility  Mobility Specialist Start Time (ACUTE ONLY) 1330  Mobility Specialist Stop Time (ACUTE ONLY) 1340  Mobility Specialist Time Calculation (min) (ACUTE ONLY) 10 min   Pt was received in recliner and agreed to mobility. No complaints during ambulation. Returned to recliner with all needs met. Call bell in reach.  [employer] - Mobility Specialist

## 2024-03-08 NOTE — Plan of Care (Signed)

## 2024-03-08 NOTE — Assessment & Plan Note (Addendum)
 Recent A1c 7.3 Resume Glucophage Carb modified diet

## 2024-03-09 ENCOUNTER — Telehealth: Payer: Self-pay

## 2024-03-09 NOTE — Transitions of Care (Post Inpatient/ED Visit) (Signed)
 03/09/2024  Name: Joyce Hatfield MRN: [id number] DOB: [birth year]  Today's TOC FU Call Status: Today's TOC FU Call Status:: Successful TOC FU Call Completed TOC FU Call Complete Date: 03/09/24  Patient's Name and Date of Birth confirmed. Name, DOB  Transition Care Management Follow-up Telephone Call Date of Discharge: 03/08/24 Discharge Facility: [facility]) Type of Discharge: Inpatient Admission Primary Inpatient Discharge Diagnosis:: syncope How have you been since you were released from the hospital?: Better Any questions or concerns?: No  Items Reviewed: Did you receive and understand the discharge instructions provided?: Yes Medications obtained,verified, and reconciled?: Yes (Medications Reviewed) Any new allergies since your discharge?: No Dietary orders reviewed?: Yes Do you have support at home?: Yes People in Home [RPT]: spouse  Medications Reviewed Today: Medications Reviewed Today     Reviewed by Emmitt Pan, LPN (Licensed Practical Nurse) on 03/09/24 at 1555  Med List Status: <None>   Medication Order Taking? Sig Documenting Provider Last Dose Status Informant  alendronate  (FOSAMAX ) 70 MG tablet [id number] Yes Take 1 tablet (70 mg total) by mouth once a week. Take with a full glass of water on an empty stomach. Jodie Lavern CROME, MD  Active Self, Pharmacy Records           Med Note Alleman, DUROJAHYE' R   Sun Mar 07, 2024  4:19 PM) Sundays  Calcium  Carbonate-Vitamin D 600-400 MG-UNIT tablet [id number] Yes Take 1 tablet by mouth daily. [provider]  Active Self, Pharmacy Records  Cholecalciferol (VITAMIN D) 1000 UNITS capsule [id number] Yes Take 1,000 Units by mouth daily. [provider]  Active Self, Pharmacy Records  Continuous Glucose Sensor (FREESTYLE LIBRE 3 PLUS SENSOR) MISC [id number] Yes CHANGE SENSOR EVERY 15 DAYS. Jodie Lavern CROME, MD  Active Self, Pharmacy Records  ferrous sulfate 325 (65 FE) MG tablet [id number] Yes Take by  mouth. [provider]  Active Self, Pharmacy Records  fexofenadine (ALLEGRA) 180 MG tablet [id number] Yes Take by mouth. [provider]  Active Self, Pharmacy Records  hydrochlorothiazide  (HYDRODIURIL ) 25 MG tablet [id number] Yes TAKE 1 TABLET (25 MG TOTAL) BY MOUTH DAILY. Jodie Lavern CROME, MD  Active Self, Pharmacy Records  lisinopril  (ZESTRIL ) 10 MG tablet [id number] Yes Take 1 tablet (10 mg total) by mouth daily. Jodie Lavern CROME, MD  Active Self, Pharmacy Records  metFORMIN (GLUCOPHAGE) 500 MG tablet [id number] Yes Take 1 tablet (500 mg total) by mouth 2 (two) times daily with a meal. Jodie Lavern CROME, MD  Active Self, Pharmacy Records  Multiple Vitamin (MULTIVITAMIN) capsule [id number] Yes Take 1 capsule by mouth daily. [provider]  Active Self, Pharmacy Records  Omega-3 Fatty Acids (FISH OIL BURP-LESS) 1000 MG CAPS [id number] Yes  [provider]  Active Self, Pharmacy Records  rosuvastatin  (CRESTOR ) 5 MG tablet [id number] Yes Take 5 mg by mouth at bedtime. [provider]  Active Self, Pharmacy Records  Turmeric 500 MG CAPS [id number] Yes  [provider]  Active Self, Pharmacy Records  valACYclovir  (VALTREX ) 500 MG tablet [id number] Yes Take 1 tablet (500 mg total) by mouth as needed (PRN). Jodie Lavern CROME, MD  Active Self, Pharmacy Records  vitamin k 100 MCG tablet [id number] Yes  [provider]  Active Self, Pharmacy Records            Home Care and Equipment/Supplies: Were Home Health Services Ordered?: NA Any new equipment or medical supplies ordered?: NA  Functional Questionnaire: Do you need assistance with bathing/showering or dressing?: No Do you need  assistance with meal preparation?: No Do you need assistance with eating?: No Do you have difficulty maintaining continence: No Do you need assistance with getting out of bed/getting out of a chair/moving?: No Do you have difficulty managing or taking your medications?:  No  Follow up appointments reviewed: PCP Follow-up appointment confirmed?: Yes Date of PCP follow-up appointment?: 03/11/24 Follow-up Provider: [facility] Follow-up appointment confirmed?: NA Do you need transportation to your follow-up appointment?: No Do you understand care options if your condition(s) worsen?: Yes-patient verbalized understanding    SIGNATURE Julian Lemmings, LPN [facility] Nurse Health Advisor Direct Dial [phone]

## 2024-03-10 DIAGNOSIS — R55 Syncope and collapse: Secondary | ICD-10-CM | POA: Diagnosis not present

## 2024-03-11 ENCOUNTER — Encounter: Payer: Self-pay | Admitting: Family Medicine

## 2024-03-11 ENCOUNTER — Ambulatory Visit (INDEPENDENT_AMBULATORY_CARE_PROVIDER_SITE_OTHER): Admitting: Family Medicine

## 2024-03-11 VITALS — BP 136/84 | HR 69 | Temp 97.7°F | Ht 69.0 in | Wt 154.6 lb

## 2024-03-11 DIAGNOSIS — E1165 Type 2 diabetes mellitus with hyperglycemia: Secondary | ICD-10-CM | POA: Diagnosis not present

## 2024-03-11 DIAGNOSIS — E1159 Type 2 diabetes mellitus with other circulatory complications: Secondary | ICD-10-CM

## 2024-03-11 DIAGNOSIS — R55 Syncope and collapse: Secondary | ICD-10-CM | POA: Diagnosis not present

## 2024-03-11 DIAGNOSIS — Z7984 Long term (current) use of oral hypoglycemic drugs: Secondary | ICD-10-CM

## 2024-03-11 DIAGNOSIS — I152 Hypertension secondary to endocrine disorders: Secondary | ICD-10-CM

## 2024-03-11 NOTE — Progress Notes (Signed)
 Subjective  CC:  Chief Complaint  Patient presents with   Hospitalization Follow-up    03/07/2024 - 03/08/2024 (31 hours) [facility]    Syncopy    HPI: Joyce Hatfield is a 78 y.o. female who presents to the office today to address the problems listed above in the chief complaint. Discussed the use of AI scribe software for clinical note transcription with the patient, who gave verbal consent to proceed.  History of Present Illness Joyce Hatfield is a 78 year old female who presents with a recent episode of syncope.  I reviewed hospital records including H&P, discharge summary, multiple lab, EKG and brain imaging. Briefly, syncopal episode with prodromal lightheadedness.  Facial continues.  Negative workup.  Wearing Zio patch heart monitor now.  Risk factors, diabetes recently started on metformin.  Hypertension on diuretic.  Has history of asymptomatic ectopic beats.  Syncope and associated symptoms - Experienced a sudden episode of dizziness followed by syncope while walking her dog - Fell to the left side onto grass and leaves, avoiding injury to face or head - No palpitations, nausea, or diaphoresis prior to the event - Able to get up and walk home after the fall - No further episodes of lightheadedness or syncope since the initial event - Felt weak for a couple of days following the incident, attributed to rest Please see H&P.  Discharge summary  Post-syncope evaluation and monitoring - Underwent x-rays following the incident, which showed no fractures - Currently wearing a heart monitor to assess for cardiac arrhythmias - Checked blood pressure and blood sugar immediately after the event; both were within reasonable ranges  Glycemic management and medication tolerance - On metformin for the past three to four weeks, titrated from once daily to twice daily dosing - Experienced initial gastrointestinal discomfort with metformin, now resolved - Blood glucose  levels stable; recent CGM shows seven-day GMI of 6.3%  Antihypertensive therapy - Currently taking a diuretic  Daily activities and lifestyle - Lives with her husband and a 70-pound Labrador dog - Engages in daily walking as part of her routine - No unusual activities or dietary changes on the day of the event; followed usual routine of tea and breakfast before walk   Assessment  1. Syncope and collapse   2. Uncontrolled type 2 diabetes mellitus with hyperglycemia (HCC)   3. Hypertension associated with diabetes [facility])      Plan  Assessment and Plan Assessment & Plan Syncope Recent episode of syncope during a walk, resulting in a fall. No fractures or significant injuries.  Possible vasovagal. - Continue wearing heart monitor for two weeks - Follow up with cardiologist on December 8th - Monitor for any further episodes of lightheadedness or syncope - Ensure adequate hydration, especially given diuretic use  Type 2 diabetes mellitus Blood sugar levels well-managed with metformin. Initial gastrointestinal side effects have resolved. Continuous glucose monitor (CGM) shows GMI of 6.3, indicating good control. A1c expected to align with CGM results. - Continue metformin as prescribed - Monitor blood sugar levels regularly, no history of hypoglycemia. - Maintain hydration  Hypertension Blood pressure management ongoing. Recent syncope episode raises concern for potential dehydration, especially with diuretic use. Blood pressure monitoring advised. - Continue current antihypertensive regimen - Monitor blood pressure regularly - Ensure adequate hydration - Current blood pressure is high normal.    Follow up: As scheduled No orders of the defined types were placed in this encounter.  No orders of the defined types were  placed in this encounter.    I reviewed the patients updated PMH, FH, and SocHx.  Patient Active Problem List   Diagnosis Date Noted   Combined hyperlipidemia  associated with type 2 diabetes mellitus (HCC) 10/04/2021    Priority: High   Controlled type 2 diabetes mellitus without complication, without long-term current use of insulin (HCC) 06/26/2020    Priority: High   Hx of melanoma excision 11/13/2018    Priority: High   Hypertension associated with diabetes (HCC) 03/16/2014    Priority: High   Serrated adenoma of colon 11/08/2013    Priority: High   History of obstructive sleep apnea 07/01/2022    Priority: Medium    Hepatic steatosis 10/04/2021    Priority: Medium    Restless legs 11/12/2018    Priority: Medium    Osteoporosis, postmenopausal 05/05/2018    Priority: Medium    Herpesviral vesicular dermatitis 07/15/2022    Priority: Low   Liver cyst 09/17/2021    Priority: Low   Gilbert disease 01/04/2021    Priority: Low   Chronic allergic rhinitis 11/12/2018    Priority: Low   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 Take with a full glass of water on an empty stomach.   Calcium  Carbonate-Vitamin D 600-400 MG-UNIT tablet Take 1 tablet by mouth daily.   Cholecalciferol (VITAMIN D) 1000 UNITS capsule Take 1,000 Units by mouth daily.   Continuous Glucose Sensor (FREESTYLE LIBRE 3 PLUS SENSOR) MISC CHANGE SENSOR EVERY 15 DAYS.   ferrous sulfate 325 (65 FE) MG tablet Take by mouth.   fexofenadine (ALLEGRA) 180 MG tablet Take by mouth.   hydrochlorothiazide  (HYDRODIURIL ) 25 MG tablet TAKE 1 TABLET (25 MG TOTAL) BY MOUTH DAILY.   lisinopril  (ZESTRIL ) 10 MG tablet Take 1 tablet (10 mg total) by mouth daily.   metFORMIN (GLUCOPHAGE) 500 MG tablet Take 1 tablet (500 mg total) by mouth 2 (two) times daily with a meal.   Multiple Vitamin (MULTIVITAMIN) capsule Take 1 capsule by mouth daily.   Omega-3 Fatty Acids (FISH OIL BURP-LESS) 1000 MG  CAPS    rosuvastatin  (CRESTOR ) 5 MG tablet Take 5 mg by mouth at bedtime.   Turmeric 500 MG CAPS    valACYclovir  (VALTREX ) 500 MG tablet Take 1 tablet (500 mg total) by mouth as needed (PRN).   vitamin k 100 MCG tablet    Allergies: Patient is allergic to bee venom. Family History: Patient family history includes Breast cancer in her paternal aunt; Cancer in her mother; Diabetes in her brother; Early death in her paternal grandmother; Heart disease in her father and paternal grandfather; Hypertension in her father and mother. Social History:  Patient  reports that she has never smoked. She has never used smokeless tobacco. She reports that she does not drink alcohol and does not use drugs.  Review of Systems: Constitutional: Negative for fever malaise or anorexia Cardiovascular: negative for chest pain Respiratory: negative for SOB or persistent cough Gastrointestinal: negative for abdominal pain  Objective  Vitals: BP 136/84   Pulse 69   Temp 97.7 F (36.5 C)   Ht 5' 9 (1.753 m)   Wt 154 lb 9.6 oz (70.1 kg)   SpO2 96%   BMI 22.83 kg/m  General: no acute distress , A&Ox3 HEENT:  PEERL, conjunctiva normal, neck is supple, nasal abrasions healing on her nose.  Ecchymosis above her lip. Cardiovascular:  RRR without murmur or gallop.  Has several ectopic beats and brief pauses. Respiratory:  Good breath sounds bilaterally, CTAB with normal respiratory effort Skin:  Warm, no rashes Commons side effects, risks, benefits, and alternatives for medications and treatment plan prescribed today were discussed, and the patient expressed understanding of the given instructions. Patient is instructed to call or message via MyChart if he/she has any questions or concerns regarding our treatment plan. No barriers to understanding were identified. We discussed Red Flag symptoms and signs in detail. Patient expressed understanding regarding what to do in case of urgent or emergency type symptoms.   Medication list was reconciled, printed and provided to the patient in AVS. Patient instructions and summary information was reviewed with the patient as documented in the AVS. This note was prepared with assistance of Dragon voice recognition software. Occasional wrong-word or sound-a-like substitutions may have occurred due to the inherent limitations of voice recognition software

## 2024-03-19 DIAGNOSIS — H5203 Hypermetropia, bilateral: Secondary | ICD-10-CM | POA: Diagnosis not present

## 2024-03-19 DIAGNOSIS — H18593 Other hereditary corneal dystrophies, bilateral: Secondary | ICD-10-CM | POA: Diagnosis not present

## 2024-03-19 DIAGNOSIS — E119 Type 2 diabetes mellitus without complications: Secondary | ICD-10-CM | POA: Diagnosis not present

## 2024-03-19 DIAGNOSIS — Z0100 Encounter for examination of eyes and vision without abnormal findings: Secondary | ICD-10-CM | POA: Diagnosis not present

## 2024-03-19 LAB — OPHTHALMOLOGY REPORT-SCANNED

## 2024-04-01 ENCOUNTER — Encounter: Payer: Self-pay | Admitting: Family Medicine

## 2024-04-02 NOTE — Progress Notes (Unsigned)
    Cardiology Office Note Date:  04/05/2024  ID:  Joyce Hatfield, [place] 23-Jan-1946, MRN [id number] PCP:  Jodie Lavern CROME, MD  Cardiologist:   Joelle VEAR Ren Donley, MD  Chief Complaint  Patient presents with   Loss of Consciousness      Problems Syncope TTE 11/25 60-65%, late systolic mild to moderate MR EM 11/25: 14-day isolated VE ~6%, max HR DM HTN/HLD HA1C 9/25 7.3, LDL 100 M: HTZ 25, LL10, RN5  Visits  12/25: ROB, ILR    History of Present Illness: Joyce Hatfield is a 78 y.o. female who presents for post hospitalization follow up.   She presented to hospital following syncopal event. She was walking with her dog and suddenly felt lightheaded and then passed out. She was unconscious for about 5 mins. She denies any nausea or chest pain prior to the episode. She denies any syncopal events in the past. She is very active, walking multiple times a week without any symptoms. She denies any tobacco use and reports family history of arrhythmia c/b cardiac arrest in her mother.   ROS: Please see the history of present illness. All other systems are reviewed and negative.    PHYSICAL EXAM: VS:  BP 122/76   Pulse 68   Ht 5' 9 (1.753 m)   Wt 153 lb 12.8 oz (69.8 kg)   SpO2 96%   BMI 22.71 kg/m  , BMI Body mass index is 22.71 kg/m. GEN: Well nourished, well developed, in no acute distress HEENT: normal Neck: no JVD, carotid bruits, or masses Cardiac: RRR; no murmurs, rubs, or gallops,no edema  Respiratory:  CTAB bilaterally, normal work of breathing GI: soft, nontender, nondistended, + BS Extremities: No LE edema Skin: warm and dry, no rash Neuro:  Strength and sensation are intact  EKG: NSR  Recent Labs: Reviewed  Studies: Reviewed  ASSESSMENT AND PLAN: Joyce Hatfield is a 78 y.o. female who presents for hospitalization follow up. - Presenting with 1 episode of syncope. Given mitral valve prolapse on echo, 6% PVC burden, and family history of  tachr-arrhythmia (c/b cardiac arrest), there is strong concern for cardiac etiology.  - We will start metop XL 25 mg daily and refer to EP for ILR. - We will follow up in 1 year and repeat echo at that time.    Signed, Joelle VEAR Ren Donley, MD  04/05/2024 11:42 AM    [facility]

## 2024-04-05 ENCOUNTER — Ambulatory Visit

## 2024-04-05 VITALS — BP 122/76 | HR 68 | Ht 69.0 in | Wt 153.8 lb

## 2024-04-05 DIAGNOSIS — E782 Mixed hyperlipidemia: Secondary | ICD-10-CM

## 2024-04-05 DIAGNOSIS — E119 Type 2 diabetes mellitus without complications: Secondary | ICD-10-CM | POA: Diagnosis not present

## 2024-04-05 DIAGNOSIS — I493 Ventricular premature depolarization: Secondary | ICD-10-CM

## 2024-04-05 DIAGNOSIS — I10 Essential (primary) hypertension: Secondary | ICD-10-CM

## 2024-04-05 DIAGNOSIS — R55 Syncope and collapse: Secondary | ICD-10-CM

## 2024-04-05 DIAGNOSIS — E1169 Type 2 diabetes mellitus with other specified complication: Secondary | ICD-10-CM | POA: Diagnosis not present

## 2024-04-05 MED ORDER — METOPROLOL SUCCINATE ER 25 MG PO TB24: 25.0000 mg | ORAL_TABLET | Freq: Every day | ORAL | 1 refills | Status: AC

## 2024-04-05 NOTE — Patient Instructions (Addendum)
 Medication Instructions:  START Toprol  XL (Metoprolol  Succinate) 25mg  Take 1 tablet once a day  *If you need a refill on your cardiac medications before your next appointment, please call your pharmacy*  Lab Work: None ordered If you have labs (blood work) drawn today and your tests are completely normal, you will receive your results only by: MyChart Message (if you have MyChart) OR A paper copy in the mail If you have any lab test that is abnormal or we need to change your treatment, we will call you to review the results.  Testing/Procedures: None ordered  Follow-Up: At [facility], you and your health needs are our priority.  As part of our continuing mission to provide you with exceptional heart care, our providers are all part of one team.  This team includes your primary Cardiologist (physician) and Advanced Practice Providers or APPs (Physician Assistants and Nurse Practitioners) who all work together to provide you with the care you need, when you need it.  Your next appointment:   12 month(s)  Provider:   Joelle VEAR Ren Donley, MD   You have been referred to EP (electrophysiology).   We recommend signing up for the patient portal called MyChart.  Sign up information is provided on this After Visit Summary.  MyChart is used to connect with patients for Virtual Visits (Telemedicine).  Patients are able to view lab/test results, encounter notes, upcoming appointments, etc.  Non-urgent messages can be sent to your provider as well.   To learn more about what you can do with MyChart, go to [email or web address].   Other Instructions

## 2024-04-20 DIAGNOSIS — R55 Syncope and collapse: Secondary | ICD-10-CM | POA: Diagnosis not present

## 2024-05-10 ENCOUNTER — Encounter: Payer: Self-pay | Admitting: Family Medicine

## 2024-05-10 ENCOUNTER — Ambulatory Visit: Admitting: Family Medicine

## 2024-05-10 VITALS — BP 122/64 | HR 65 | Temp 97.2°F | Ht 69.0 in | Wt 151.6 lb

## 2024-05-10 DIAGNOSIS — Z7984 Long term (current) use of oral hypoglycemic drugs: Secondary | ICD-10-CM

## 2024-05-10 DIAGNOSIS — E1169 Type 2 diabetes mellitus with other specified complication: Secondary | ICD-10-CM

## 2024-05-10 DIAGNOSIS — E782 Mixed hyperlipidemia: Secondary | ICD-10-CM

## 2024-05-10 DIAGNOSIS — R55 Syncope and collapse: Secondary | ICD-10-CM

## 2024-05-10 DIAGNOSIS — E119 Type 2 diabetes mellitus without complications: Secondary | ICD-10-CM

## 2024-05-10 DIAGNOSIS — I493 Ventricular premature depolarization: Secondary | ICD-10-CM | POA: Diagnosis not present

## 2024-05-10 LAB — LIPID PANEL
Cholesterol: 182 mg/dL (ref 28–200)
HDL: 68.1 mg/dL
LDL Cholesterol: 96 mg/dL (ref 10–99)
NonHDL: 113.86
Total CHOL/HDL Ratio: 3
Triglycerides: 91 mg/dL (ref 10.0–149.0)
VLDL: 18.2 mg/dL (ref 0.0–40.0)

## 2024-05-10 LAB — COMPREHENSIVE METABOLIC PANEL WITH GFR
ALT: 13 U/L (ref 3–35)
AST: 17 U/L (ref 5–37)
Albumin: 4.2 g/dL (ref 3.5–5.2)
Alkaline Phosphatase: 66 U/L (ref 39–117)
BUN: 23 mg/dL (ref 6–23)
CO2: 30 meq/L (ref 19–32)
Calcium: 9.4 mg/dL (ref 8.4–10.5)
Chloride: 102 meq/L (ref 96–112)
Creatinine, Ser: 0.84 mg/dL (ref 0.40–1.20)
GFR: 67.67 mL/min
Glucose, Bld: 159 mg/dL — ABNORMAL HIGH (ref 70–99)
Potassium: 3.6 meq/L (ref 3.5–5.1)
Sodium: 138 meq/L (ref 135–145)
Total Bilirubin: 1.1 mg/dL (ref 0.2–1.2)
Total Protein: 6.5 g/dL (ref 6.0–8.3)

## 2024-05-10 LAB — HEMOGLOBIN A1C: Hgb A1c MFr Bld: 6.8 % — ABNORMAL HIGH (ref 4.6–6.5)

## 2024-05-10 MED ORDER — ROSUVASTATIN CALCIUM 5 MG PO TABS: ORAL_TABLET | ORAL | Status: DC

## 2024-05-10 NOTE — Progress Notes (Signed)
 "  Subjective  CC:  Chief Complaint  Patient presents with   Medical Management of Chronic Issues    Pt needing f/u for medication change, pt started Metformin  3 months ago and reports to side effects to medication thus far    HPI: Joyce Hatfield is a 79 y.o. female who presents to the office today for follow up of diabetes and problems listed above in the chief complaint.  Discussed the use of AI scribe software for clinical note transcription with the patient, who gave verbal consent to proceed.  History of Present Illness Joyce Hatfield is a 79 year old female with diabetes and hyperlipidemia who presents for follow-up of her diabetes management and cholesterol medication adjustment.  Diabetes: started met 3 mo ago. Diet is improving. First time on meds. CGM shows improvement. No complications - Diabetes managed with metformin  500 mg twice daily - Blood glucose levels improved, with continuous glucose monitoring showing values around 6.5 to 6.6 - No gastrointestinal side effects from metformin   Dyslipidemia management and statin-associated myalgia; increased dose to push LDL to goal. Last was 100 on 5mg  at bedtime.  - On modified dosing regimen of Crestor  (rosuvastatin ): 5 mg daily and 10 mg on Monday, Wednesday, and Friday - Muscle cramps occurred with daily 10 mg dose, prompting dose adjustment - Not taking Coenzyme Q10 - diet is improving  Cardiac rhythm disturbance/h/o syncope. Reviewed cards notes. Possible cardiac related.  - MVP, 6% PVC burden on zio patch and + FH of arrhythmia - No recent palpitations or lightheadedness. Feels well - Follow-up appointment with electrophysiologist scheduled for February 6th    Wt Readings from Last 3 Encounters:  05/10/24 151 lb 9.6 oz (68.8 kg)  04/05/24 153 lb 12.8 oz (69.8 kg)  03/11/24 154 lb 9.6 oz (70.1 kg)    BP Readings from Last 3 Encounters:  05/10/24 122/64  04/05/24 122/76  03/11/24 136/84    Assessment  1.  Controlled type 2 diabetes mellitus without complication, without long-term current use of insulin  (HCC)   2. Combined hyperlipidemia associated with type 2 diabetes mellitus (HCC)   3. Diabetes mellitus treated with oral medication (HCC)   4. Syncope and collapse   5. Frequent PVCs      Plan  Assessment and Plan Assessment & Plan Type 2 diabetes mellitus Managed with metformin  500 mg twice daily. Blood glucose levels have improved but are not yet at the target range. Current CGM readings show 80-85% time in range up to 150 mg/dL and 39% time in range up to 140 mg/dL. No gastrointestinal side effects reported. Goal is to maintain HbA1c under 7% to prevent long-term complications. - Continue metformin  500 mg twice daily - Monitor blood glucose levels with CGM - Will consider increasing metformin  dose if HbA1c remains above target - continue diet and exercise  Combined hyperlipidemia Managed with Crestor . Current regimen is 5 mg daily and 10 mg on Monday, Wednesday, and Friday. Previous attempt at 10 mg daily resulted in muscle cramps, hence the current dosing schedule. No Coenzyme Q10 supplementation currently, which may help reduce side effects. - Continue current Crestor  dosing regimen - Consider Coenzyme Q10 supplementation to reduce side effects - recheck fasting lipids today and lfts  Syncope: to EP next months. Now on low dose bb. Feels well.     Follow up: 6 mo for recheck Orders Placed This Encounter  Procedures   Hemoglobin A1c   Lipid panel   Comprehensive metabolic panel with GFR  No orders of the defined types were placed in this encounter.     Immunization History  Administered Date(s) Administered   Fluad Quad(high Dose 65+) 03/01/2021, 03/06/2022   INFLUENZA, HIGH DOSE SEASONAL PF 02/16/2016, 02/05/2017, 02/03/2019, 01/31/2023, 01/26/2024   Influenza Inj Mdck Quad Pf 05/05/2018   Influenza-Unspecified 02/24/2020   PFIZER Comirnaty(Gray Top)Covid-19  Tri-Sucrose Vaccine 01/25/2022   PFIZER(Purple Top)SARS-COV-2 Vaccination 06/05/2019, 06/30/2019, 02/04/2020, 09/05/2020   Pfizer Covid-19 Vaccine Bivalent Booster 38yrs & up 01/15/2021   Pneumococcal Conjugate-13 06/15/2013   Pneumococcal Polysaccharide-23 09/28/2015   Respiratory Syncytial Virus Vaccine,Recomb Aduvanted(Arexvy) 02/15/2022   Tdap 10/23/2006, 04/04/2017   Unspecified SARS-COV-2 Vaccination 12/29/2022, 01/13/2024   Zoster Recombinant(Shingrix) 10/09/2016, 12/22/2016   Zoster, Live 04/30/2007, 03/05/2010    Diabetes Related Lab Review: Lab Results  Component Value Date   HGBA1C 7.3 (H) 01/23/2024   HGBA1C 6.5 (A) 10/22/2023   HGBA1C 6.8 (A) 07/22/2023    Lab Results  Component Value Date   MICROALBUR 0.7 01/23/2024   Lab Results  Component Value Date   CREATININE 0.72 03/08/2024   BUN 22 03/08/2024   NA 140 03/08/2024   K 3.8 03/08/2024   CL 106 03/08/2024   CO2 25 03/08/2024   Lab Results  Component Value Date   CHOL 187 01/23/2024   CHOL 183 01/22/2023   CHOL 177 01/04/2022   Lab Results  Component Value Date   HDL 74.20 01/23/2024   HDL 77.80 01/22/2023   HDL 74.10 01/04/2022   Lab Results  Component Value Date   LDLCALC 100 (H) 01/23/2024   LDLCALC 92 01/22/2023   LDLCALC 93 01/04/2022   Lab Results  Component Value Date   TRIG 66.0 01/23/2024   TRIG 69.0 01/22/2023   TRIG 51.0 01/04/2022   Lab Results  Component Value Date   CHOLHDL 3 01/23/2024   CHOLHDL 2 01/22/2023   CHOLHDL 2 01/04/2022   No results found for: LDLDIRECT The 10-year ASCVD risk score (Arnett DK, et al., 2019) is: 37.6%   Values used to calculate the score:     Age: 72 years     Clinically relevant sex: Female     Is Non-Hispanic African American: No     Diabetic: Yes     Tobacco smoker: No     Systolic Blood Pressure: 122 mmHg     Is BP treated: Yes     HDL Cholesterol: 74.2 mg/dL     Total Cholesterol: 187 mg/dL I have reviewed the PMH, Fam and Soc  history. Patient Active Problem List   Diagnosis Date Noted Date Diagnosed   Combined hyperlipidemia associated with type 2 diabetes mellitus (HCC) 10/04/2021     Priority: High    Can't tolerate crestor  10; takes 5     Controlled type 2 diabetes mellitus without complication, without long-term current use of insulin  (HCC) 06/26/2020     Priority: High   Hx of melanoma excision 11/13/2018     Priority: High   Hypertension associated with diabetes (HCC) 03/16/2014     Priority: High   Serrated adenoma of colon 11/08/2013     Priority: High    2015 sessile serrated adenoma size ? And tubular adenoma recheck 5 yr    History of obstructive sleep apnea 07/01/2022     Priority: Medium     Stopped cpap 2024 after 40 pound weight loss    Hepatic steatosis 10/04/2021     Priority: Medium     Ultrasound 2022    Restless legs 11/12/2018  Priority: Medium     Takes daily iron supplement for symptom improvement.    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 Was treated with fosamax  x 5 years, then drug holiday, then restarted 2018. Drug holiday starting 2023   DEXA may 2025: lowest T = - 2.6 L hip. Rec restarting treatment. Restarted fosamax .  10/2020: T = -2.4 femur. Osteopenia.  09/25/2016 FRAX 19/3.4 ; low impact distal radius fracture. 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  See records from [facility]    Mitral regurgitation 03/08/2024     Syncope 03/07/2024     Social History: Patient  reports that she has never smoked. She has never used smokeless tobacco. She reports that she does not drink alcohol and does not use drugs.  Review of Systems: Ophthalmic: negative for eye pain, loss of vision or double vision Cardiovascular: negative for chest pain Respiratory: negative for SOB or persistent cough Gastrointestinal: negative for abdominal pain Genitourinary: negative for dysuria or gross hematuria MSK: negative for foot lesions Neurologic: negative for weakness or gait disturbance  Objective  Vitals: BP 122/64   Pulse 65   Temp (!) 97.2 F (36.2 C) (Temporal)   Ht 5' 9 (1.753 m)   Wt 151 lb 9.6 oz (68.8 kg)   SpO2 96%   BMI 22.39 kg/m  General: well appearing, no acute distress  Psych:  Alert and oriented, normal mood and affect HEENT:  Normocephalic, atraumatic, moist mucous membranes, supple neck  Cardiovascular:  Nl S1 and S2, RRR without murmur, gallop or rub. no edema  Diabetic education: ongoing education regarding chronic disease management for diabetes was given today. We continue to reinforce the ABC's of diabetic management: A1c (<7 or 8 dependent upon patient), tight blood pressure control, and cholesterol management with goal LDL < 100 minimally. We discuss diet strategies, exercise recommendations, medication options and possible side effects. At each visit, we review recommended immunizations and preventive care recommendations for diabetics and stress that good diabetic control can prevent other problems. See below for this patient's data. Commons side effects, risks, benefits, and alternatives for medications and treatment plan prescribed today were discussed, and the patient expressed understanding of the given instructions. Patient is instructed to call or message via MyChart if he/she has any questions or concerns regarding our treatment plan. No barriers to understanding were identified. We  discussed Red Flag symptoms and signs in detail. Patient expressed understanding regarding what to do in case of urgent or emergency type symptoms.  Medication list was reconciled, printed and provided to the patient in AVS. Patient instructions and summary information was reviewed with the patient as documented in the AVS. This note was prepared with assistance of Dragon voice recognition software. Occasional wrong-word or sound-a-like substitutions may have occurred due to the inherent limitations of voice recognition software   "

## 2024-05-10 NOTE — Patient Instructions (Signed)
 Please return in 6 months to recheck diabetes and blood pressure   I will release your lab results to you on your MyChart account with further instructions. You may see the results before I do, but when I review them I will send you a message with my report or have my assistant call you if things need to be discussed. Please reply to my message with any questions. Thank you!   If you have any questions or concerns, please don't hesitate to send me a message via MyChart or call the office at [phone]. Thank you for visiting with us  today! It's our pleasure caring for you.    VISIT SUMMARY: Today, we discussed the management of your diabetes and cholesterol levels. Your blood glucose levels have improved with your current medication, and we have adjusted your cholesterol medication to reduce muscle cramps. We also reviewed your heart rhythm and scheduled a follow-up with a specialist.  YOUR PLAN: -TYPE 2 DIABETES MELLITUS: Type 2 diabetes is a condition where your body does not use insulin  properly, leading to high blood sugar levels. Your diabetes is currently managed with metformin  500 mg twice daily. Your blood glucose levels have improved, but we aim to keep your HbA1c under 7% to prevent complications. Continue taking metformin  as prescribed and monitor your blood glucose levels with your continuous glucose monitor (CGM). We may consider increasing your metformin  dose if your HbA1c remains above the target range.  -COMBINED HYPERLIPIDEMIA: Combined hyperlipidemia is a condition where you have high levels of cholesterol and triglycerides in your blood. Your cholesterol is managed with Crestor  (rosuvastatin ). Due to muscle cramps with a higher dose, your current regimen is 5 mg daily and 10 mg on Monday, Wednesday, and Friday. We recommend continuing this dosing schedule and considering Coenzyme Q10 supplementation to help reduce muscle cramps.  -CARDIAC RHYTHM DISTURBANCE: A cardiac rhythm  disturbance means that your heart sometimes beats irregularly. Previous monitoring showed extra heartbeats, but you have not had recent palpitations. You have a follow-up appointment with an electrophysiologist on February 6th to further evaluate your heart rhythm.  INSTRUCTIONS: Please continue taking your medications as prescribed and monitor your blood glucose levels regularly. Consider starting Coenzyme Q10 supplementation to help with muscle cramps. Attend your follow-up appointment with the electrophysiologist on February 6th to discuss your heart rhythm. If you have any new symptoms or concerns, please contact our office.                      Contains text generated by [employer]bridge.                                 Contains text generated by [employer]bridge.

## 2024-05-11 ENCOUNTER — Ambulatory Visit: Payer: Self-pay | Admitting: Family Medicine

## 2024-05-11 NOTE — Telephone Encounter (Signed)
 noted

## 2024-05-11 NOTE — Progress Notes (Signed)
 See mychart note Dear Joyce Hatfield, Your diabetic control has improved.  A1c is down to 6.8.  I believe we can maintain here, however if we increase metformin  to 1000 twice a day this will help lower that number.  Please let me know if you prefer to do that.  Your cholesterol has also slightly improved but remains above goal.  I recommend starting coenzyme Q 10 to see if that will help you tolerate the rosuvastatin .  It would be good to increase the dose of that if possible.  If not we could consider adding another cholesterol-lowering medication.  We would like her LDL to be lower than 70 if possible. Sincerely, Dr. Jodie

## 2024-05-22 ENCOUNTER — Other Ambulatory Visit: Payer: Self-pay | Admitting: Family Medicine

## 2024-05-27 ENCOUNTER — Other Ambulatory Visit: Payer: Self-pay | Admitting: Family Medicine

## 2024-06-04 ENCOUNTER — Ambulatory Visit: Admitting: Student in an Organized Health Care Education/Training Program

## 2024-07-19 ENCOUNTER — Ambulatory Visit: Admitting: Family Medicine

## 2024-08-02 ENCOUNTER — Ambulatory Visit: Admitting: Family Medicine

## 2024-08-11 ENCOUNTER — Ambulatory Visit: Admitting: Student in an Organized Health Care Education/Training Program

## 2024-09-07 ENCOUNTER — Ambulatory Visit
